# Patient Record
Sex: Male | Born: 1955 | Hispanic: No | Marital: Married | Smoking: Current every day smoker
Health system: Southern US, Community
[De-identification: ages and names within clinical notes are randomized; demographics above are authoritative.]

## PROBLEM LIST (undated history)

## (undated) ENCOUNTER — Emergency Department (HOSPITAL_COMMUNITY): Discharge status: Against Medical Advice | Payer: BC Managed Care – PPO

## (undated) DIAGNOSIS — K649 Unspecified hemorrhoids: Secondary | ICD-10-CM

## (undated) DIAGNOSIS — J45909 Unspecified asthma, uncomplicated: Secondary | ICD-10-CM

## (undated) DIAGNOSIS — K602 Anal fissure, unspecified: Secondary | ICD-10-CM

## (undated) DIAGNOSIS — D126 Benign neoplasm of colon, unspecified: Secondary | ICD-10-CM

## (undated) DIAGNOSIS — K5790 Diverticulosis of intestine, part unspecified, without perforation or abscess without bleeding: Secondary | ICD-10-CM

## (undated) HISTORY — DX: Benign neoplasm of colon, unspecified: D12.6

## (undated) HISTORY — DX: Unspecified hemorrhoids: K64.9

## (undated) HISTORY — DX: Diverticulosis of intestine, part unspecified, without perforation or abscess without bleeding: K57.90

## (undated) HISTORY — DX: Unspecified asthma, uncomplicated: J45.909

## (undated) HISTORY — PX: COLONOSCOPY: SHX174

## (undated) HISTORY — DX: Anal fissure, unspecified: K60.2

## (undated) HISTORY — PX: EXTERNAL AUDITORY CANAL RECONSTRUCTION: SHX428

---

## 2000-04-01 ENCOUNTER — Emergency Department (HOSPITAL_COMMUNITY): Admission: EM | Admit: 2000-04-01 | Discharge: 2000-04-02 | Payer: Self-pay | Admitting: Emergency Medicine

## 2001-09-16 ENCOUNTER — Ambulatory Visit (HOSPITAL_COMMUNITY): Admission: RE | Admit: 2001-09-16 | Discharge: 2001-09-16 | Payer: Self-pay | Admitting: Internal Medicine

## 2010-10-16 ENCOUNTER — Encounter: Payer: Self-pay | Admitting: Gastroenterology

## 2011-04-29 NOTE — Letter (Signed)
Summary: Colonoscopy Letter  [facility]  [address]   [place], [place] [address]   Phone: [phone]  Fax: [phone]      October 16, 2010 MRN: [id number]   Corey Camacho [address] [place], [place]  [address]   Dear [name],   According to your medical record, it is time for you to schedule a Colonoscopy. The American Cancer Society recommends this procedure as a method to detect early colon cancer. Patients with a family history of colon cancer, or a personal history of colon polyps or inflammatory bowel disease are at increased risk.  This letter has been generated based on the recommendations made at the time of your procedure. If you feel that in your particular situation this may no longer apply, please contact our office.  Please call our office at [phone] to schedule this appointment or to update your records at your earliest convenience.  Thank you for cooperating with [place] to provide you with the very best care possible.   Sincerely,   Claudette Head, M.D.  [facility] Gastroenterology [facility] [phone]

## 2012-07-15 ENCOUNTER — Encounter: Payer: Self-pay | Admitting: Gastroenterology

## 2012-07-23 ENCOUNTER — Encounter: Payer: Self-pay | Admitting: Gastroenterology

## 2012-07-27 ENCOUNTER — Encounter: Payer: Self-pay | Admitting: Gastroenterology

## 2012-08-04 DIAGNOSIS — D126 Benign neoplasm of colon, unspecified: Secondary | ICD-10-CM

## 2012-08-04 HISTORY — DX: Benign neoplasm of colon, unspecified: D12.6

## 2012-08-10 ENCOUNTER — Ambulatory Visit (AMBULATORY_SURGERY_CENTER): Payer: BC Managed Care – PPO | Admitting: *Deleted

## 2012-08-10 ENCOUNTER — Encounter: Payer: Self-pay | Admitting: Gastroenterology

## 2012-08-10 VITALS — Ht 71.0 in | Wt 212.9 lb

## 2012-08-10 DIAGNOSIS — Z1211 Encounter for screening for malignant neoplasm of colon: Secondary | ICD-10-CM

## 2012-08-10 MED ORDER — NA SULFATE-K SULFATE-MG SULF 17.5-3.13-1.6 GM/177ML PO SOLN: ORAL | Status: DC

## 2012-08-24 ENCOUNTER — Ambulatory Visit (AMBULATORY_SURGERY_CENTER): Payer: BC Managed Care – PPO | Admitting: Gastroenterology

## 2012-08-24 ENCOUNTER — Encounter: Payer: Self-pay | Admitting: Gastroenterology

## 2012-08-24 VITALS — BP 102/68 | HR 68 | Temp 98.5°F | Resp 22 | Ht 71.0 in | Wt 212.0 lb

## 2012-08-24 DIAGNOSIS — D126 Benign neoplasm of colon, unspecified: Secondary | ICD-10-CM

## 2012-08-24 DIAGNOSIS — Z1211 Encounter for screening for malignant neoplasm of colon: Secondary | ICD-10-CM

## 2012-08-24 MED ORDER — SODIUM CHLORIDE 0.9 % IV SOLN: 500.0000 mL | INTRAVENOUS | Status: DC

## 2012-08-24 NOTE — Progress Notes (Signed)
Patient did not experience any of the following events: a burn prior to discharge; a fall within the facility; wrong site/side/patient/procedure/implant event; or a hospital transfer or hospital admission upon discharge from the facility. (G8907) Patient did not have preoperative order for IV antibiotic SSI prophylaxis. (G8918)  

## 2012-08-24 NOTE — Patient Instructions (Addendum)
Colon polyp x 1 removed,diverticulosis and hemorrhoids seen. Try to follow high fiber diet with liberal fluid intake. See All handouts. Resume current medications. Call us with any questions or concerns. Thank you!!  YOU HAD AN ENDOSCOPIC PROCEDURE TODAY AT THE  ENDOSCOPY CENTER: Refer to the procedure report that was given to you for any specific questions about what was found during the examination.  If the procedure report does not answer your questions, please call your gastroenterologist to clarify.  If you requested that your care partner not be given the details of your procedure findings, then the procedure report has been included in a sealed envelope for you to review at your convenience later.  YOU SHOULD EXPECT: Some feelings of bloating in the abdomen. Passage of more gas than usual.  Walking can help get rid of the air that was put into your GI tract during the procedure and reduce the bloating. If you had a lower endoscopy (such as a colonoscopy or flexible sigmoidoscopy) you may notice spotting of blood in your stool or on the toilet paper. If you underwent a bowel prep for your procedure, then you may not have a normal bowel movement for a few days.  DIET: Your first meal following the procedure should be a light meal and then it is ok to progress to your normal diet.  A half-sandwich or bowl of soup is an example of a good first meal.  Heavy or fried foods are harder to digest and may make you feel nauseous or bloated.  Likewise meals heavy in dairy and vegetables can cause extra gas to form and this can also increase the bloating.  Drink plenty of fluids but you should avoid alcoholic beverages for 24 hours.  ACTIVITY: Your care partner should take you home directly after the procedure.  You should plan to take it easy, moving slowly for the rest of the day.  You can resume normal activity the day after the procedure however you should NOT DRIVE or use heavy machinery for 24 hours  (because of the sedation medicines used during the test).    SYMPTOMS TO REPORT IMMEDIATELY: A gastroenterologist can be reached at any hour.  During normal business hours, 8:30 AM to 5:00 PM Monday through Friday, call [phone].  After hours and on weekends, please call the GI answering service at [phone] who will take a message and have the physician on call contact you.   Following lower endoscopy (colonoscopy or flexible sigmoidoscopy):  Excessive amounts of blood in the stool  Significant tenderness or worsening of abdominal pains  Swelling of the abdomen that is new, acute  Fever of 100F or higher  FOLLOW UP: If any biopsies were taken you will be contacted by phone or by letter within the next 1-3 weeks.  Call your gastroenterologist if you have not heard about the biopsies in 3 weeks.  Our staff will call the home number listed on your records the next business day following your procedure to check on you and address any questions or concerns that you may have at that time regarding the information given to you following your procedure. This is a courtesy call and so if there is no answer at the home number and we have not heard from you through the emergency physician on call, we will assume that you have returned to your regular daily activities without incident.  SIGNATURES/CONFIDENTIALITY: You and/or your care partner have signed paperwork which will be entered into your  electronic medical record.  These signatures attest to the fact that that the information above on your After Visit Summary has been reviewed and is understood.  Full responsibility of the confidentiality of this discharge information lies with you and/or your care-partner.

## 2012-08-24 NOTE — Progress Notes (Signed)
Pressure applied to abdomen to reach cecum.  

## 2012-08-24 NOTE — Op Note (Signed)
[facility] [address] [place], [address]   COLONOSCOPY PROCEDURE REPORT  PATIENT: Corey Camacho, Corey Camacho  MR#: [id number] BIRTHDATE: 1956/07/25 , 56  yrs. old GENDER: Male ENDOSCOPIST: Meryl Dare, MD, [facility]                        n] PROCEDURE DATE:  08/24/2012 PROCEDURE:   Colonoscopy with snare polypectomy ASA CLASS:   Class II INDICATIONS:average risk screening. MEDICATIONS: MAC sedation, administered by CRNA and propofol (Diprivan) 270mg  IV DESCRIPTION OF PROCEDURE:   After the risks benefits and alternatives of the procedure were thoroughly explained, informed consent was obtained.  A digital rectal exam revealed no abnormalities of the rectum.   The     endoscope was introduced through the anus and advanced to the cecum, which was identified by both the appendix and ileocecal valve. No adverse events experienced.   The quality of the prep was good, using MoviPrep The instrument was then slowly withdrawn as the colon was fully examined.   COLON FINDINGS: A sessile polyp measuring 6 mm in size was found at the hepatic flexure.  A polypectomy was performed with a cold snare.  The resection was complete and the polyp tissue was completely retrieved.   Mild diverticulosis was noted in the transverse colon.   The colon was otherwise normal.  There was no diverticulosis, inflammation, polyps or cancers unless previously stated.  Retroflexed views revealed small internal hemorrhoids. The time to cecum=3 minutes 23 seconds.  Withdrawal time=8 minutes 52 seconds.  The scope was withdrawn and the procedure completed. COMPLICATIONS: There were no complications.  ENDOSCOPIC IMPRESSION: 1.   Sessile polyp at the hepatic flexure; polypectomy was performed with a cold snare 2.   Mild diverticulosis was noted in the transverse colon 3.   Small internal hemorrhoids  RECOMMENDATIONS: 1.  await pathology results 2.  Repeat colonoscopy in 5 years if polyp adenomatous;  otherwise 10 years 3.  High fiber diet with liberal fluid intake.   eSigned:  Meryl Dare, MD, [facility] 08/24/2012 2:33 PM   cc: Jarome Matin, MD

## 2012-08-25 ENCOUNTER — Telehealth: Payer: Self-pay | Admitting: *Deleted

## 2012-08-25 NOTE — Telephone Encounter (Signed)
  Follow up Call-  Call back number 08/24/2012  Post procedure Call Back phone  # [phone]  Permission to leave phone message Yes     Patient questions:  Do you have a fever, pain , or abdominal swelling? no Pain Score  0 *  Have you tolerated food without any problems? yes  Have you been able to return to your normal activities? yes  Do you have any questions about your discharge instructions: Diet   no Medications  no Follow up visit  no  Do you have questions or concerns about your Care? no  Actions: * If pain score is 4 or above: No action needed, pain <4.  Pt. Has slight "bloating" without pain.  Advised to be up and around today to help air to move out.  Also, to call office if "bloating" increases or pain occurs.  He is eating without discomfort and is afebrile.

## 2012-08-27 ENCOUNTER — Encounter: Payer: Self-pay | Admitting: Gastroenterology

## 2013-08-09 ENCOUNTER — Telehealth: Payer: Self-pay | Admitting: Gastroenterology

## 2013-08-09 NOTE — Telephone Encounter (Signed)
Patient with one month history of bright red rectal bleeding and pain with a BM.  "good amount of blood" with each BM.  He is not passing blood independent of stool.  He will come in tomorrow and see Mike Gip PA at 9:30

## 2013-08-10 ENCOUNTER — Ambulatory Visit (INDEPENDENT_AMBULATORY_CARE_PROVIDER_SITE_OTHER): Payer: BC Managed Care – PPO | Admitting: Physician Assistant

## 2013-08-10 ENCOUNTER — Encounter: Payer: Self-pay | Admitting: Physician Assistant

## 2013-08-10 VITALS — BP 106/68 | HR 70 | Ht 71.0 in | Wt 212.0 lb

## 2013-08-10 DIAGNOSIS — Z86010 Personal history of colonic polyps: Secondary | ICD-10-CM

## 2013-08-10 DIAGNOSIS — K602 Anal fissure, unspecified: Secondary | ICD-10-CM

## 2013-08-10 DIAGNOSIS — K5730 Diverticulosis of large intestine without perforation or abscess without bleeding: Secondary | ICD-10-CM

## 2013-08-10 DIAGNOSIS — K625 Hemorrhage of anus and rectum: Secondary | ICD-10-CM

## 2013-08-10 MED ORDER — DILTIAZEM GEL 2 %: 1.0000 "application " | Freq: Three times a day (TID) | CUTANEOUS | Status: DC

## 2013-08-10 MED ORDER — LIDOCAINE (ANORECTAL) 5 % EX CREA: 1.0000 "application " | TOPICAL_CREAM | Freq: Three times a day (TID) | CUTANEOUS | Status: DC

## 2013-08-10 NOTE — Patient Instructions (Addendum)
You have been given a separate informational sheet regarding your tobacco use, the importance of quitting and local resources to help you quit. Do Sitz bath once daily. We have given you Recticare samples, use 2-3 times daily rectally for pain. We called in a prescription to [facility] [phone] ) located at the [facility], between [address] and [address].   Diltiazem 2 % Gel, use rectally 3 times daily.    We made you a follow up with Dr. Claudette Head on 09-09-2013 @ 11:15 AM.

## 2013-08-10 NOTE — Progress Notes (Signed)
Subjective:    Patient ID: Corey ANDRADA, male    DOB: 1956/09/18, 57 y.o.   MRN: [id number]  HPI  Corey Camacho is a pleasant 57 year old male known to Dr. Russella Dar from colonoscopy done in October of 2013. This was done for screening and he was found to have a 6 mm sessile polyp at the hepatic flexure which was a tubular adenoma, also had mild left-sided diverticulosis and small internal hemorrhoids. He comes in today with new complaint of rectal bleeding and rectal pain. He says he went [place] in [place] and had some problems with constipation while there he came back in August and says he went for many hours without having a bowel movement etc. with long overseas flights and and when he was able to have a bowel movement passed hard stool and did develop rectal pain with some blood noted in the commode. He says he waited several weeks thinking this would clear however he continues to have pain in his rectum with bowel movements. He is having a bowel movement about every other day and says his stools are not really constipated at this point. He does have MiraLax to use at home as needed. He has no complaints of abdominal pain cramping pressure changes in his appetite etc. He is still having rectal discomfort primarily with and after a bowel movement, and sees intermittent small amounts of bright red blood    Review of Systems  Constitutional: Negative.   HENT: Negative.   Eyes: Negative.   Respiratory: Negative.   Cardiovascular: Negative.   Gastrointestinal: Positive for constipation, anal bleeding and rectal pain.  Endocrine: Negative.   Genitourinary: Negative.   Musculoskeletal: Negative.   Skin: Negative.   Allergic/Immunologic: Negative.   Neurological: Negative.   Hematological: Negative.   Psychiatric/Behavioral: Negative.    Outpatient Prescriptions Prior to Visit  Medication Sig Dispense Refill  . HYDROcodone-homatropine (HYCODAN) 5-1.5 MG/5ML syrup       . Misc Natural Products (MENS  PROSTATE HEALTH FORMULA PO) Take 2 capsules by mouth daily.       No facility-administered medications prior to visit.       Allergies  Allergen Reactions  . Accutane [Isotretinoin] Swelling   Patient Active Problem List   Diagnosis Date Noted  . H/O adenomatous polyp of colon 08/10/2013  . Diverticulosis of colon without hemorrhage 08/10/2013   History  Substance Use Topics  . Smoking status: Current Every Day Smoker -- 0.50 packs/day    Types: Cigarettes  . Smokeless tobacco: Never Used  . Alcohol Use: 2.0 oz/week    4 drink(s) per week   family history includes Esophageal cancer in his father; Prostate cancer in his father. There is no history of Colon cancer or Stomach cancer.  Objective:   Physical Exam  well-developed male in no acute distress, pleasant blood pressure 106/68 pulse 70 height 5 foot 11 weight 212. HEENT; nontraumatic normocephalic EOMI PERRLA sclera anicteric, Supple; no JVD, Cardiovascular; regular rate and rhythm with S1-S2 no murmur or gallop, Pulmonary; clear bilaterally, Abdomen; soft nontender nondistended bowel sounds are active there is no palpable mass or hepatosplenomegaly, Rectal; exam he has an external tag at the 12:00 position and on anoscopy and digital exam has a small fissure at the 12:00 position. Ext;; no clubbing cyanosis or edema skin warm and dry, Psych ;mood and affect normal and appropriate        Assessment & Plan:    #26 57 year old male with an anal fissure probably present over the  past 2 months. #2 small internal hemorrhoids #3 adenomatous colon polyp last colonoscopy October 2013 #4 mild constipation #5 diverticulosis  Plan; patient is advised to start sitz bath at least on a once daily basis over the next few weeks MiraLax 17 g in 8 ounces of water daily as needed Start recti care/lidocaine ointment 5% in 2-3 times daily as needed for pain and discomfort Start Cardizem gel 2% applied to 3 times daily and may alternate with  the lidocaine ointment He'll followup with Dr. Russella Dar in 4-5 weeks If he remains symptomatic at that point may need to consider Botox injections or surgical referral

## 2013-08-12 NOTE — Progress Notes (Signed)
Reviewed and agree with management plan.  Ardelle Haliburton T. Mariadejesus Cade, MD FACG 

## 2013-09-09 ENCOUNTER — Ambulatory Visit: Payer: BC Managed Care – PPO | Admitting: Gastroenterology

## 2013-09-21 ENCOUNTER — Ambulatory Visit (INDEPENDENT_AMBULATORY_CARE_PROVIDER_SITE_OTHER): Payer: BC Managed Care – PPO | Admitting: General Surgery

## 2013-09-21 ENCOUNTER — Encounter (INDEPENDENT_AMBULATORY_CARE_PROVIDER_SITE_OTHER): Payer: Self-pay | Admitting: General Surgery

## 2013-09-21 ENCOUNTER — Encounter (INDEPENDENT_AMBULATORY_CARE_PROVIDER_SITE_OTHER): Payer: Self-pay

## 2013-09-21 VITALS — BP 114/68 | HR 80 | Temp 97.0°F | Resp 16 | Ht 71.0 in | Wt 214.4 lb

## 2013-09-21 DIAGNOSIS — L0231 Cutaneous abscess of buttock: Secondary | ICD-10-CM

## 2013-09-21 NOTE — Progress Notes (Signed)
Subjective:     Patient ID: Corey Camacho, male   DOB: 03/28/1956, 57 y.o.   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 The patient has been there for 5+ years. He states his recent episode has been occurring for the last 2-4 weeks. Patient states that she has had some minimal drainage there this time, mainly blood  Review of Systems  Constitutional: Negative.  Negative for fever.  HENT: Negative.   Respiratory: Negative.   Cardiovascular: Negative.   Gastrointestinal: Negative.   Neurological: Negative.   All other systems reviewed and are negative.       Objective:   Physical Exam  Constitutional: He is oriented to person, place, and time. He appears well-developed and well-nourished.  HENT:  Head: Normocephalic and atraumatic.  Eyes: Conjunctivae and EOM are normal. Pupils are equal, round, and reactive to light.  Neck: Normal range of motion. Neck supple.  Cardiovascular: Normal rate, regular rhythm and normal heart sounds.   Pulmonary/Chest: Effort normal and breath sounds normal.  Abdominal: Soft. Bowel sounds are normal.  Genitourinary:     Musculoskeletal: Normal range of motion.  Neurological: He is alert and oriented to person, place, and time.       Assessment:     57 year old male with a chronic left gluteal drain abscess.     Plan:     1. A number the area of concern and aspirated with an 18-gauge needle. There was no expression of purulence. 2. The patient was given a prescription for doxycycline, by Dr. Jarold Motto. He can continue this as prescribed 3.  We will have patient follow back up in 2 weeks for a wound check. Should area notresolved we can discuss exam under anesthesia and excision of abscess/chronic infected abscess.

## 2013-10-05 ENCOUNTER — Encounter (INDEPENDENT_AMBULATORY_CARE_PROVIDER_SITE_OTHER): Payer: Self-pay

## 2013-10-05 ENCOUNTER — Encounter (INDEPENDENT_AMBULATORY_CARE_PROVIDER_SITE_OTHER): Payer: Self-pay | Admitting: General Surgery

## 2013-10-05 ENCOUNTER — Ambulatory Visit (INDEPENDENT_AMBULATORY_CARE_PROVIDER_SITE_OTHER): Payer: BC Managed Care – PPO | Admitting: General Surgery

## 2013-10-05 VITALS — BP 132/72 | HR 74 | Temp 97.6°F | Resp 18 | Ht 71.0 in | Wt 217.0 lb

## 2013-10-05 DIAGNOSIS — L0231 Cutaneous abscess of buttock: Secondary | ICD-10-CM

## 2013-10-05 NOTE — Progress Notes (Signed)
Subjective:     Patient ID: Corey Camacho, male   DOB: October 28, 1956, 57 y.o.   MRN: [id number]  HPI The patient has been doing well after his antibiotic treatment. He states there is no pain or drainage coming from the area of concern. She had no fevers at home her pain with defecation.  Review of Systems  Constitutional: Negative.   HENT: Negative.   Respiratory: Negative.   Cardiovascular: Negative.   Gastrointestinal: Negative.   Neurological: Negative.        Objective:   Physical Exam  Constitutional: He is oriented to person, place, and time. He appears well-developed and well-nourished.  HENT:  Head: Normocephalic and atraumatic.  Eyes: Conjunctivae and EOM are normal. Pupils are equal, round, and reactive to light.  Neck: Normal range of motion. Neck supple.  Cardiovascular: Normal rate, regular rhythm and normal heart sounds.   Pulmonary/Chest: Effort normal and breath sounds normal.  Abdominal: Soft. Bowel sounds are normal.  Neurological: He is alert and oriented to person, place, and time.  Skin: Skin is warm and dry.       Assessment:     57 year old male with a resolved perirectal abscess     Plan:     1. Patient to return as needed. 2. If this returns with a discussed exam under anesthesia for possible fistula.

## 2013-10-07 ENCOUNTER — Encounter (INDEPENDENT_AMBULATORY_CARE_PROVIDER_SITE_OTHER): Payer: BC Managed Care – PPO | Admitting: General Surgery

## 2013-12-20 ENCOUNTER — Ambulatory Visit
Admission: RE | Admit: 2013-12-20 | Discharge: 2013-12-20 | Disposition: A | Discharge status: Home / Self Care | Payer: BC Managed Care – PPO | Source: Ambulatory Visit | Attending: Allergy | Admitting: Allergy

## 2013-12-20 ENCOUNTER — Other Ambulatory Visit: Payer: Self-pay | Admitting: Allergy

## 2013-12-20 DIAGNOSIS — J329 Chronic sinusitis, unspecified: Secondary | ICD-10-CM

## 2015-02-15 ENCOUNTER — Ambulatory Visit
Admission: RE | Admit: 2015-02-15 | Discharge: 2015-02-15 | Disposition: A | Discharge status: Home / Self Care | Payer: Self-pay | Source: Ambulatory Visit | Attending: Allergy | Admitting: Allergy

## 2015-02-15 ENCOUNTER — Other Ambulatory Visit: Payer: Self-pay | Admitting: Allergy

## 2015-02-15 DIAGNOSIS — J209 Acute bronchitis, unspecified: Secondary | ICD-10-CM

## 2015-02-26 ENCOUNTER — Encounter (HOSPITAL_COMMUNITY): Payer: Self-pay | Admitting: *Deleted

## 2015-02-26 DIAGNOSIS — J159 Unspecified bacterial pneumonia: Secondary | ICD-10-CM | POA: Diagnosis not present

## 2015-02-26 DIAGNOSIS — Z7952 Long term (current) use of systemic steroids: Secondary | ICD-10-CM | POA: Diagnosis not present

## 2015-02-26 DIAGNOSIS — Z720 Tobacco use: Secondary | ICD-10-CM | POA: Insufficient documentation

## 2015-02-26 DIAGNOSIS — Z7951 Long term (current) use of inhaled steroids: Secondary | ICD-10-CM | POA: Insufficient documentation

## 2015-02-26 DIAGNOSIS — Z8719 Personal history of other diseases of the digestive system: Secondary | ICD-10-CM | POA: Diagnosis not present

## 2015-02-26 DIAGNOSIS — R05 Cough: Secondary | ICD-10-CM | POA: Diagnosis present

## 2015-02-26 LAB — BASIC METABOLIC PANEL
Anion gap: 9 (ref 5–15)
BUN: 18 mg/dL (ref 6–23)
CHLORIDE: 102 mmol/L (ref 96–112)
CO2: 24 mmol/L (ref 19–32)
Calcium: 8.4 mg/dL (ref 8.4–10.5)
Creatinine, Ser: 1.04 mg/dL (ref 0.50–1.35)
GFR calc non Af Amer: 78 mL/min — ABNORMAL LOW (ref >90)
Glucose, Bld: 128 mg/dL — ABNORMAL HIGH (ref 70–99)
Potassium: 4.2 mmol/L (ref 3.5–5.1)
Sodium: 135 mmol/L (ref 135–145)

## 2015-02-26 LAB — CBC
HEMATOCRIT: 47.2 % (ref 39.0–52.0)
Hemoglobin: 15.8 g/dL (ref 13.0–17.0)
MCH: 27.1 pg (ref 26.0–34.0)
MCHC: 33.5 g/dL (ref 30.0–36.0)
MCV: 80.8 fL (ref 78.0–100.0)
PLATELETS: 248 10*3/uL (ref 150–400)
RBC: 5.84 MIL/uL — AB (ref 4.22–5.81)
RDW: 15.8 % — ABNORMAL HIGH (ref 11.5–15.5)
WBC: 12.7 10*3/uL — ABNORMAL HIGH (ref 4.0–10.5)

## 2015-02-26 NOTE — ED Notes (Signed)
Pt states that he was seen by his PCP 2 weeks ago and prescribed antibiotics. States he then went to Dr Donneta Romberg for his allergies and was given inhaler and prednisone. Currently c/o cough, fever, weakness.

## 2015-02-27 ENCOUNTER — Emergency Department (HOSPITAL_COMMUNITY)
Admission: EM | Admit: 2015-02-27 | Discharge: 2015-02-27 | Disposition: A | Discharge status: Home / Self Care | Payer: 59 | Attending: Emergency Medicine | Admitting: Emergency Medicine

## 2015-02-27 ENCOUNTER — Emergency Department (HOSPITAL_COMMUNITY): Payer: 59

## 2015-02-27 DIAGNOSIS — R05 Cough: Secondary | ICD-10-CM

## 2015-02-27 DIAGNOSIS — J189 Pneumonia, unspecified organism: Secondary | ICD-10-CM

## 2015-02-27 DIAGNOSIS — R059 Cough, unspecified: Secondary | ICD-10-CM

## 2015-02-27 MED ORDER — LEVOFLOXACIN 750 MG PO TABS: 750.0000 mg | ORAL_TABLET | Freq: Every day | ORAL | Status: DC

## 2015-02-27 MED ORDER — LEVOFLOXACIN 750 MG PO TABS
750.0000 mg | ORAL_TABLET | Freq: Once | ORAL | Status: AC
  Administered 2015-02-27: 750 mg via ORAL
  Filled 2015-02-27: qty 1

## 2015-02-27 NOTE — ED Provider Notes (Signed)
CSN: [id number]     Arrival date & time 02/26/15  2240 History   First MD Initiated Contact with Patient 02/27/15 0018     This chart was scribed for Corey Rice, MD by Forrestine Him, ED Scribe. This patient was seen in room A12C/A12C and the patient's care was started 12:26 AM.   No chief complaint on file.  HPI  HPI Comments: Corey Camacho is a 59 y.o. male without any pertinent past medical history who presents to the Emergency Department complaining of productive cough of yellow sputum. Pt also reportsassociated low grade fever, chills, nasal congestion, generalized weakness and myalgias. Pt was evaluated by his PCP 2 weeks ago and was started on an antibiotic for cough, wheezing, and nasal congestion. At time of appointment, CXR performed without any evidence of PNA. Pt states he then saw Dr. Donneta Romberg 4/18 for allergies and was given prescriptions for Prednisone, rescue inhaler, and nasal spray. Daughter was recently diagnosed with walking PNA 1 week ago. Pt with known allergy to Accutane.  Past Medical History  Diagnosis Date  . Diverticulosis   . Hemorrhoids   . Tubular adenoma of colon 08/2012  . Anal fissure    Past Surgical History  Procedure Laterality Date  . 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 Colon cancer Neg Hx   . Stomach cancer Neg Hx   . Esophageal cancer Father   . Prostate cancer Father    History  Substance Use Topics  . Smoking status: Current Every Day Smoker -- 0.50 packs/day    Types: Cigarettes  . Smokeless tobacco: Never Used  . Alcohol Use: 2.0 oz/week    4 Standard drinks or equivalent per week    Review of Systems  Constitutional: Positive for fever, chills and fatigue.  HENT: Positive for congestion. Negative for sinus pressure and sore throat.   Respiratory: Positive for cough. Negative for shortness of breath and wheezing.   Cardiovascular: Negative for  chest pain, palpitations and leg swelling.  Gastrointestinal: Negative for nausea, vomiting and abdominal pain.  Genitourinary: Negative for dysuria and flank pain.  Musculoskeletal: Positive for myalgias. Negative for back pain, arthralgias, neck pain and neck stiffness.  Skin: Negative for rash and wound.  Neurological: Positive for weakness. Negative for dizziness, light-headedness, numbness and headaches.  All other systems reviewed and are negative.     Allergies  Accutane  Home Medications   Prior to Admission medications   Medication Sig Start Date End Date Taking? Authorizing Provider  BREO ELLIPTA 200-25 MCG/INH AEPB Inhale 1 puff into the lungs daily.    Yes Historical Provider, MD  fluticasone (FLONASE) 50 MCG/ACT nasal spray Place 2 sprays into the nose daily.  02/13/15  Yes Historical Provider, MD  levocetirizine (XYZAL) 5 MG tablet Take 5 mg by mouth every evening.  02/13/15  Yes Historical Provider, MD  predniSONE (DELTASONE) 10 MG tablet Take 10 mg by mouth daily with breakfast.  02/13/15 02/27/15 Yes Historical Provider, MD  diltiazem 2 % GEL Apply 1 application topically 3 (three) times daily. Patient not taking: Reported on 02/27/2015 08/10/13   Amy S Esterwood, PA-C  levofloxacin (LEVAQUIN) 750 MG tablet Take 1 tablet (750 mg total) by mouth daily. X 7 days 02/27/15   Corey Rice, MD  Lidocaine, Anorectal, (RECTICARE) 5 % CREA Apply 1 application topically 3 (three) times daily. Patient not taking: Reported on 02/27/2015 08/10/13  Amy S Esterwood, PA-C   Triage Vitals: BP 109/68 mmHg  Pulse 84  Temp(Src) 99.6 F (37.6 C) (Oral)  Resp 22  Ht 5\' 11"  (1.803 m)  Wt 205 lb (92.987 kg)  BMI 28.60 kg/m2  SpO2 92%   Physical Exam  Constitutional: He is oriented to person, place, and time. He appears well-developed and well-nourished. No distress.  HENT:  Head: Normocephalic and atraumatic.  Mouth/Throat: Oropharynx is clear and moist. No oropharyngeal exudate.  No  sinus tenderness with percussion.  Eyes: EOM are normal. Pupils are equal, round, and reactive to light.  Neck: Normal range of motion. Neck supple.  No meningismus  Cardiovascular: Normal rate and regular rhythm.   Pulmonary/Chest: Effort normal. No respiratory distress. He has no wheezes. He has rales ( rales in bilateral bases).  No respiratory distress  Abdominal: Soft. Bowel sounds are normal. He exhibits no distension and no mass. There is no tenderness. There is no rebound and no guarding.  Musculoskeletal: Normal range of motion. He exhibits no edema or tenderness.  Lymphadenopathy:    He has no cervical adenopathy.  Neurological: He is alert and oriented to person, place, and time.  5/5 motor in all tremors. Sensation is fully intact.  Skin: Skin is warm and dry. No rash noted. No erythema.  Psychiatric: He has a normal mood and affect. His behavior is normal.  Nursing note and vitals reviewed.   ED Course  Procedures (including critical care time)  DIAGNOSTIC STUDIES: Oxygen Saturation is 95% on RA, adequate by my interpretation.    COORDINATION OF CARE: 2:29 AM-Discussed treatment plan with pt at bedside and pt agreed to plan.     Labs Review Labs Reviewed  CBC - Abnormal; Notable for the following:    WBC 12.7 (*)    RBC 5.84 (*)    RDW 15.8 (*)    All other components within normal limits  BASIC METABOLIC PANEL - Abnormal; Notable for the following:    Glucose, Bld 128 (*)    GFR calc non Af Amer 78 (*)    All other components within normal limits    Imaging Review No results found.   EKG Interpretation None      MDM   Final diagnoses:  Cough  Community acquired pneumonia   I personally performed the services described in this documentation, which was scribed in my presence. The recorded information has been reviewed and is accurate.  Crystals infiltrate in left base. Mild elevation of white blood cell count. We'll treat for the record pneumonia.  Given first dose of Levaquin in the emergency department.  Since stable. Patient is in no wrist or distress. We'll discharge home and advised to follow-up with his primary physician to assure improvement of symptoms. He's been instructed to return for worsening fever, difficulty breathing or any concerns.  Corey Rice, MD 02/27/15 [phone]

## 2015-02-27 NOTE — Discharge Instructions (Signed)

## 2016-07-05 ENCOUNTER — Encounter: Payer: Self-pay | Admitting: Podiatry

## 2016-07-05 ENCOUNTER — Ambulatory Visit (INDEPENDENT_AMBULATORY_CARE_PROVIDER_SITE_OTHER): Payer: Self-pay | Admitting: Podiatry

## 2016-07-05 DIAGNOSIS — L309 Dermatitis, unspecified: Secondary | ICD-10-CM

## 2016-07-05 DIAGNOSIS — B351 Tinea unguium: Secondary | ICD-10-CM

## 2016-07-05 MED ORDER — TERBINAFINE HCL 250 MG PO TABS: ORAL_TABLET | ORAL | 0 refills | Status: DC

## 2016-07-05 NOTE — Progress Notes (Signed)
Chief Complaint  Patient presents with  . Toe Pain    bilateral rash between toes with itching and redness x 2 wks .... right foot blister on right 4th toe.

## 2016-07-05 NOTE — Progress Notes (Signed)
Subjective:     Patient ID: Corey Camacho, male   DOB: [birth year], 60 y.o.   MRN: [id number]  HPI patient presents with small blisters between the second and third toes right foot localized in nature with no proximal edema and he states it just occur   Review of Systems     Objective:   Physical Exam Neurovascular status intact negative Homans sign noted normal health history with patient noted to have a small blister on the right second toe localized in nature with redness surrounding the area    Assessment:     Localized mycotic infection    Plan:     Reviewed utilization of medication and recommended oral Lamisil pulse therapy along with topical medicines. Reappoint as needed

## 2016-07-05 NOTE — Progress Notes (Signed)
   Subjective:    Patient ID: Corey Camacho, male    DOB: 06-Aug-1956, 60 y.o.   MRN: [id number]  HPI    Review of Systems  Respiratory: Positive for cough.   Skin: Positive for rash.  All other systems reviewed and are negative.      Objective:   Physical Exam        Assessment & Plan:

## 2017-03-28 IMAGING — DX DG CHEST 2V
2 series · 2 of 2 positions shown · non-contrast
Comparison: Chest radiograph performed 02/15/2015

CLINICAL DATA: Acute onset of cough, congestion and fever. Initial
encounter.

EXAM:
CHEST  2 VIEW

[chest pa]
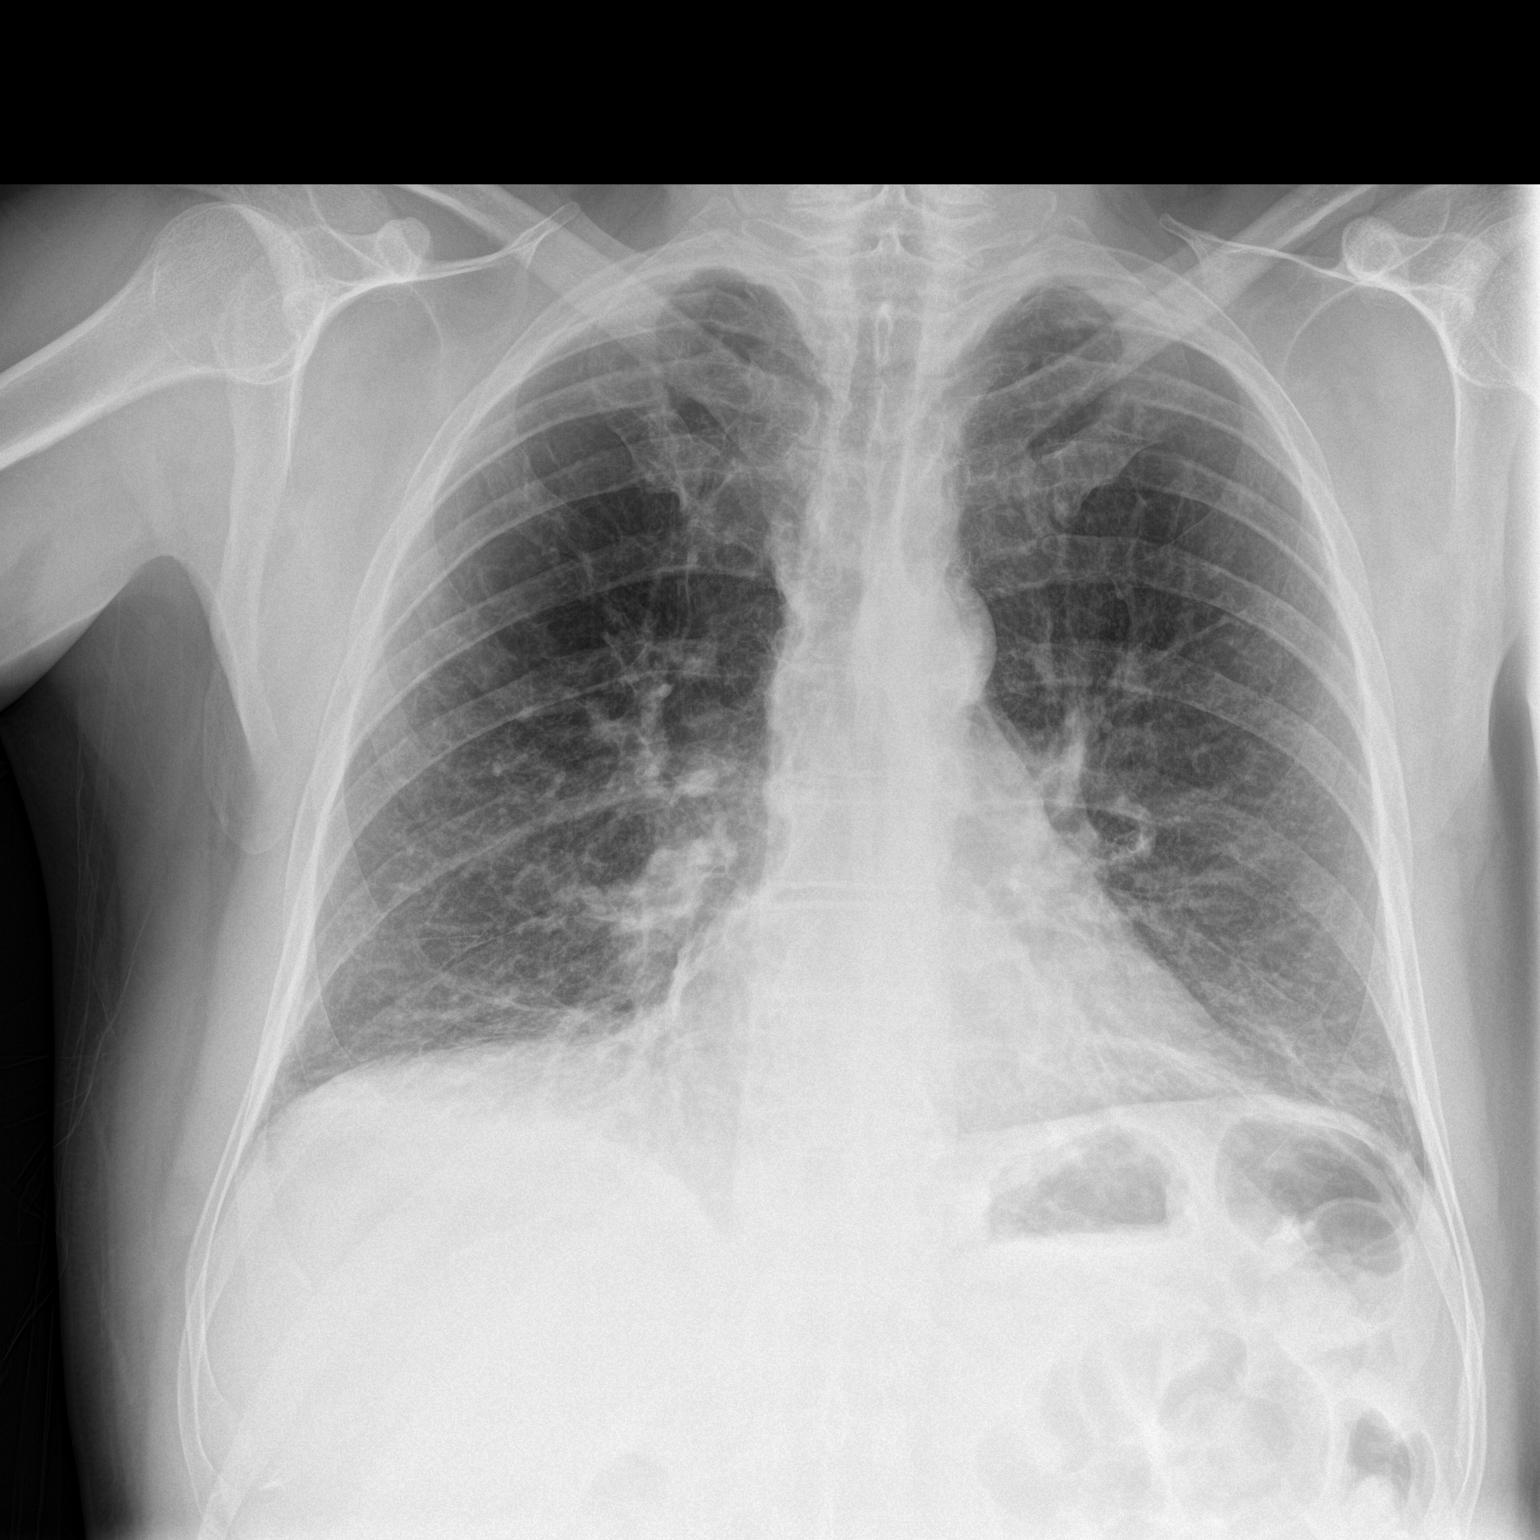

[chest lat]
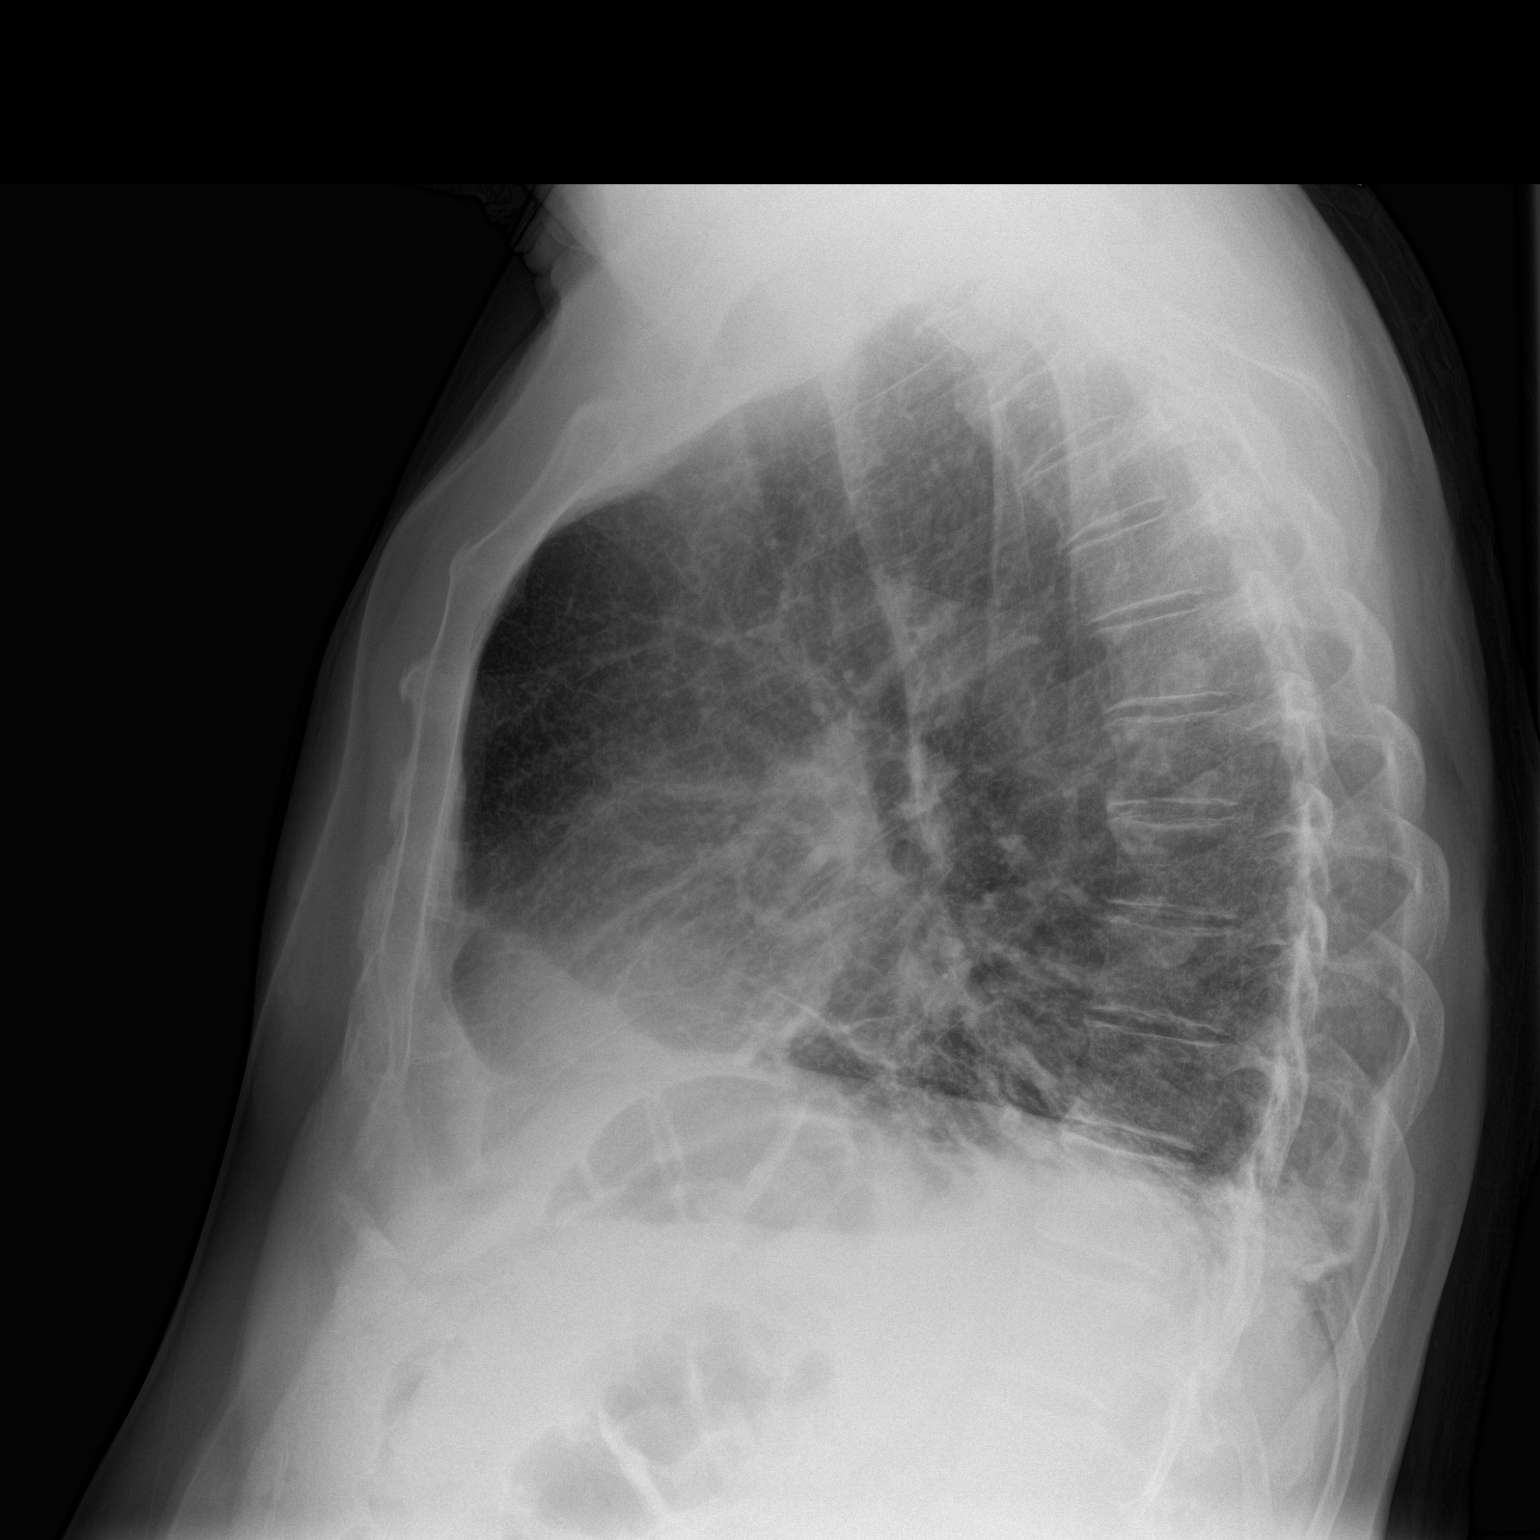

[2 of 2 positions shown; findings below may reference images not displayed]

FINDINGS: The lungs are well-aerated. Mild bibasilar opacities may reflect
mild interstitial edema, given underlying vascular congestion. Given
the patient's symptoms, pneumonia might have a similar appearance.
No pleural effusion or pneumothorax is seen.

The heart is normal in size; the mediastinal contour is within
normal limits. No acute osseous abnormalities are seen.
IMPRESSION: Mild bibasilar airspace opacities may reflect mild interstitial
edema, given underlying vascular congestion. Given the patient's
symptoms, pneumonia might have a similar appearance.

## 2017-08-26 ENCOUNTER — Encounter: Payer: Self-pay | Admitting: Gastroenterology

## 2017-10-07 ENCOUNTER — Ambulatory Visit (AMBULATORY_SURGERY_CENTER): Payer: Self-pay

## 2017-10-07 VITALS — Ht 70.0 in | Wt 198.2 lb

## 2017-10-07 DIAGNOSIS — Z86010 Personal history of colonic polyps: Secondary | ICD-10-CM

## 2017-10-07 MED ORDER — NA SULFATE-K SULFATE-MG SULF 17.5-3.13-1.6 GM/177ML PO SOLN: 1.0000 | Freq: Once | ORAL | 0 refills | Status: AC

## 2017-10-07 NOTE — Progress Notes (Signed)
Per pt, no allergies to soy or egg products.Pt not taking any weight loss meds or using  O2 at home.  Pt refused emmi video. 

## 2017-10-21 ENCOUNTER — Other Ambulatory Visit: Payer: Self-pay

## 2017-10-21 ENCOUNTER — Encounter: Payer: Self-pay | Admitting: Gastroenterology

## 2017-10-21 ENCOUNTER — Ambulatory Visit (AMBULATORY_SURGERY_CENTER): Payer: BLUE CROSS/BLUE SHIELD | Admitting: Gastroenterology

## 2017-10-21 VITALS — BP 103/69 | HR 62 | Temp 97.7°F | Resp 18 | Ht 70.0 in | Wt 198.0 lb

## 2017-10-21 DIAGNOSIS — D124 Benign neoplasm of descending colon: Secondary | ICD-10-CM | POA: Diagnosis not present

## 2017-10-21 DIAGNOSIS — D125 Benign neoplasm of sigmoid colon: Secondary | ICD-10-CM

## 2017-10-21 DIAGNOSIS — D120 Benign neoplasm of cecum: Secondary | ICD-10-CM | POA: Diagnosis not present

## 2017-10-21 DIAGNOSIS — Z86010 Personal history of colonic polyps: Secondary | ICD-10-CM | POA: Diagnosis not present

## 2017-10-21 DIAGNOSIS — D123 Benign neoplasm of transverse colon: Secondary | ICD-10-CM

## 2017-10-21 MED ORDER — SODIUM CHLORIDE 0.9 % IV SOLN: 500.0000 mL | Freq: Once | INTRAVENOUS | Status: DC

## 2017-10-21 NOTE — Op Note (Signed)
[name] Procedure Date: 10/21/2017 11:13 AM MRN: [id number] Endoscopist: Ladene Artist , MD Age: 61 Referring MD:  Date of Birth: [birth year] Gender: Male Account #: [id number] Procedure:                Colonoscopy Indications:              Surveillance: Personal history of adenomatous                            polyps on last colonoscopy 5 years ago Medicines:                Monitored Anesthesia Care Procedure:                Pre-Anesthesia Assessment:                           - Prior to the procedure, a History and Physical                            was performed, and patient medications and                            allergies were reviewed. The patient's tolerance of                            previous anesthesia was also reviewed. The risks                            and benefits of the procedure and the sedation                            options and risks were discussed with the patient.                            All questions were answered, and informed consent                            was obtained. Prior Anticoagulants: The patient has                            taken no previous anticoagulant or antiplatelet                            agents. ASA Grade Assessment: II - A patient with                            mild systemic disease. After reviewing the risks                            and benefits, the patient was deemed in                            satisfactory condition to undergo the procedure.  After obtaining informed consent, the colonoscope                            was passed under direct vision. Throughout the                            procedure, the patient's blood pressure, pulse, and                            oxygen saturations were monitored continuously. The                            Model PCF-[id number]) scope was introduced                            through the anus and  advanced to the the cecum,                            identified by appendiceal orifice and ileocecal                            valve. The ileocecal valve, appendiceal orifice,                            and rectum were photographed. The quality of the                            bowel preparation was good after extensive lavage                            and suctioning. The patient tolerated the procedure                            well. The colonoscopy was somewhat difficult due to                            a tortuous colon. Successful completion of the                            procedure was aided by using manual pressure,                            straightening and shortening the scope to obtain                            bowel loop reduction and using scope torsion. Scope In: 94:70:96 AM Scope Out: 11:39:33 AM Scope Withdrawal Time: 0 hours 16 minutes 14 seconds  Total Procedure Duration: 0 hours 22 minutes 46 seconds  Findings:                 The perianal and digital rectal examinations were                            normal.  Three sessile polyps were found in the sigmoid                            colon, descending colon and cecum. The polyps were                            6 to 7 mm in size. These polyps were removed with a                            cold snare. Resection and retrieval were complete.                           Two sessile polyps were found in the transverse                            colon. The polyps were 4 to 5 mm in size. These                            polyps were removed with a cold biopsy forceps.                            Resection and retrieval were complete.                           A few small-mouthed diverticula were found in the                            sigmoid colon, descending colon, transverse colon                            and hepatic flexure. There was no evidence of                            diverticular  bleeding.                           Internal hemorrhoids were found during                            retroflexion. The hemorrhoids were small and Grade                            I (internal hemorrhoids that do not prolapse).                           The exam was otherwise without abnormality on                            direct and retroflexion views. Complications:            No immediate complications. Estimated blood loss:                            None. Estimated Blood Loss:  Estimated blood loss: none. Impression:               - Three 6 to 7 mm polyps in the sigmoid colon, in                            the descending colon and in the cecum, removed with                            a cold snare. Resected and retrieved.                           - Two 4 to 5 mm polyps in the transverse colon,                            removed with a cold biopsy forceps. Resected and                            retrieved.                           - Mild diverticulosis in the sigmoid colon, in the                            descending colon, in the transverse colon and at                            the hepatic flexure. There was no evidence of                            diverticular bleeding.                           - Internal hemorrhoids.                           - The examination was otherwise normal on direct                            and retroflexion views. Recommendation:           - Repeat colonoscopy in 3 - 5 years for                            surveillance pending pathology review.                           - Patient has a contact number available for                            emergencies. The signs and symptoms of potential                            delayed complications were discussed with the  patient. Return to normal activities tomorrow.                            Written discharge instructions were provided to the                             patient.                           - High fiber diet.                           - Continue present medications.                           - Await pathology results. Ladene Artist, MD 10/21/2017 11:45:24 AM This report has been signed electronically.

## 2017-10-21 NOTE — Progress Notes (Signed)
No problems noted in the recovery room. maw 

## 2017-10-21 NOTE — Progress Notes (Signed)
Called to room to assist during endoscopic procedure.  Patient ID and intended procedure confirmed with present staff. Received instructions for my participation in the procedure from the performing physician.  

## 2017-10-21 NOTE — Patient Instructions (Signed)
YOU HAD AN ENDOSCOPIC PROCEDURE TODAY AT THE [facility]:   Refer to the procedure report that was given to you for any specific questions about what was found during the examination.  If the procedure report does not answer your questions, please call your gastroenterologist to clarify.  If you requested that your care partner not be given the details of your procedure findings, then the procedure report has been included in a sealed envelope for you to review at your convenience later.  YOU SHOULD EXPECT: Some feelings of bloating in the abdomen. Passage of more gas than usual.  Walking can help get rid of the air that was put into your GI tract during the procedure and reduce the bloating. If you had a lower endoscopy (such as a colonoscopy or flexible sigmoidoscopy) you may notice spotting of blood in your stool or on the toilet paper. If you underwent a bowel prep for your procedure, you may not have a normal bowel movement for a few days.  Please Note:  You might notice some irritation and congestion in your nose or some drainage.  This is from the oxygen used during your procedure.  There is no need for concern and it should clear up in a day or so.  SYMPTOMS TO REPORT IMMEDIATELY:   Following lower endoscopy (colonoscopy or flexible sigmoidoscopy):  Excessive amounts of blood in the stool  Significant tenderness or worsening of abdominal pains  Swelling of the abdomen that is new, acute  Fever of 100F or higher   For urgent or emergent issues, a gastroenterologist can be reached at any hour by calling [phone].   DIET:  We do recommend a small meal at first, but then you may proceed to your regular diet.  Drink plenty of fluids but you should avoid alcoholic beverages for 24 hours.  ACTIVITY:  You should plan to take it easy for the rest of today and you should NOT DRIVE or use heavy machinery until tomorrow (because of the sedation medicines used during the test).     FOLLOW UP: Our staff will call the number listed on your records the next business day following your procedure to check on you and address any questions or concerns that you may have regarding the information given to you following your procedure. If we do not reach you, we will leave a message.  However, if you are feeling well and you are not experiencing any problems, there is no need to return our call.  We will assume that you have returned to your regular daily activities without incident.  If any biopsies were taken you will be contacted by phone or by letter within the next 1-3 weeks.  Please call us at [phone] if you have not heard about the biopsies in 3 weeks.    SIGNATURES/CONFIDENTIALITY: You and/or your care partner have signed paperwork which will be entered into your electronic medical record.  These signatures attest to the fact that that the information above on your After Visit Summary has been reviewed and is understood.  Full responsibility of the confidentiality of this discharge information lies with you and/or your care-partner.    Handouts were given to your care partner on polyps, diverticulosis, hemorrhoids, and a high fiber diet with liberal fluid intake. You may resume your current medications today. Await biopsy results. Please call if any questions or concerns.    

## 2017-10-21 NOTE — Progress Notes (Signed)
Report given to PACU, vss 

## 2017-10-21 NOTE — Progress Notes (Signed)
Pt's states no medical or surgical changes since previsit or office visit. 

## 2017-10-22 ENCOUNTER — Telehealth: Payer: Self-pay | Admitting: *Deleted

## 2017-10-22 NOTE — Telephone Encounter (Signed)
Message left

## 2017-10-22 NOTE — Telephone Encounter (Signed)
  Follow up Call-  Call back number 10/21/2017  Post procedure Call Back phone  # [phone]  Permission to leave phone message Yes  Some recent data might be hidden     Patient questions:  Do you have a fever, pain , or abdominal swelling? No. Pain Score  0 *  Have you tolerated food without any problems? Yes.    Have you been able to return to your normal activities? Yes.    Do you have any questions about your discharge instructions: Diet   No. Medications  No. Follow up visit  No.  Do you have questions or concerns about your Care? No.  Actions: * If pain score is 4 or above: No action needed, pain <4.

## 2017-10-31 ENCOUNTER — Encounter: Payer: Self-pay | Admitting: Gastroenterology

## 2017-12-08 ENCOUNTER — Other Ambulatory Visit: Payer: Self-pay | Admitting: Podiatry

## 2017-12-08 ENCOUNTER — Encounter: Payer: Self-pay | Admitting: Podiatry

## 2017-12-08 ENCOUNTER — Ambulatory Visit (INDEPENDENT_AMBULATORY_CARE_PROVIDER_SITE_OTHER): Payer: BLUE CROSS/BLUE SHIELD | Admitting: Podiatry

## 2017-12-08 ENCOUNTER — Ambulatory Visit (INDEPENDENT_AMBULATORY_CARE_PROVIDER_SITE_OTHER): Payer: BLUE CROSS/BLUE SHIELD

## 2017-12-08 DIAGNOSIS — M25572 Pain in left ankle and joints of left foot: Secondary | ICD-10-CM

## 2017-12-08 DIAGNOSIS — M7750 Other enthesopathy of unspecified foot: Secondary | ICD-10-CM | POA: Diagnosis not present

## 2017-12-08 DIAGNOSIS — Q828 Other specified congenital malformations of skin: Secondary | ICD-10-CM | POA: Diagnosis not present

## 2017-12-08 DIAGNOSIS — M779 Enthesopathy, unspecified: Secondary | ICD-10-CM

## 2017-12-08 MED ORDER — DICLOFENAC SODIUM 75 MG PO TBEC
75.0000 mg | DELAYED_RELEASE_TABLET | Freq: Two times a day (BID) | ORAL | 2 refills | Status: DC
Start: 2017-12-08 — End: 2024-02-29

## 2017-12-08 MED ORDER — TRIAMCINOLONE ACETONIDE 10 MG/ML IJ SUSP
10.0000 mg | Freq: Once | INTRAMUSCULAR | Status: AC
  Administered 2017-12-08: 10 mg

## 2017-12-10 NOTE — Progress Notes (Signed)
Subjective:   Patient ID: Corey Camacho, male   DOB: 62 y.o.   MRN: [id number]   HPI Patient presents stating that she has a lot of pain in her left ankle and is been going on now for several months with no specific injury.  Patient states that she is tried to wear support shoes and not go barefoot but the pain continues.  Patient also is concerned about painful lesion formation that makes it hard to walk at times disease   ROS      Objective:  Physical Exam  Neurovascular status intact with inflammation of the medial ankle left at the insertion of the posterior tibial tendon into the navicular.  I checked muscle strength and found to be adequate and did not indicate there is a tear of the peroneal or the posterior tibial tendon at the current time and I also noted deep porokeratotic lesion sub-fifth metatarsal right     Assessment:  Acute posterior tibial tendinitis left with moderate depression of the arch and inflammation and keratotic lesion right     Plan:  H&P condition reviewed and careful sheath injection administered left 3 mg Kenalog 5 mg Xylocaine and applied fascial brace to lift up the arch.  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

## 2017-12-22 ENCOUNTER — Encounter: Payer: Self-pay | Admitting: Podiatry

## 2017-12-22 ENCOUNTER — Ambulatory Visit (INDEPENDENT_AMBULATORY_CARE_PROVIDER_SITE_OTHER): Payer: BLUE CROSS/BLUE SHIELD | Admitting: Podiatry

## 2017-12-22 DIAGNOSIS — B079 Viral wart, unspecified: Secondary | ICD-10-CM

## 2017-12-22 DIAGNOSIS — M779 Enthesopathy, unspecified: Secondary | ICD-10-CM

## 2017-12-22 DIAGNOSIS — M7750 Other enthesopathy of unspecified foot: Secondary | ICD-10-CM

## 2017-12-22 MED ORDER — METHYLPREDNISOLONE 4 MG PO TBPK: ORAL_TABLET | ORAL | 0 refills | Status: DC

## 2017-12-22 NOTE — Progress Notes (Signed)
Subjective:   Patient ID: Corey Camacho, male   DOB: 62 y.o.   MRN: [id number]   HPI Patient presents stating the tendon is feeling a lot better but he has a nodule on the bottom of his left arch that is been painful and been there for around a month.  Does not remember specific injury or foreign body associated with it   ROS      Objective:  Physical Exam  Neurovascular status intact with flatfoot deformity noted with inflammation around the posterior tib tendon as it gets close to insertion into the navicular and at its insertion into the navicular.  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  At this point I did scan for customized orthotics to try to lift up the plantar arch left and right and also I debrided the lesion fully with sterile instrumentation and applied cannot figure to it along with sterile dressings.  Patient will be seen back for [place] to recheck again in 4 weeks for orthotic dispensing and to evaluate the lesion.  If any blistering were to occur I explained to him what to do will be seen back

## 2018-01-26 ENCOUNTER — Ambulatory Visit (INDEPENDENT_AMBULATORY_CARE_PROVIDER_SITE_OTHER): Payer: BLUE CROSS/BLUE SHIELD | Admitting: Podiatry

## 2018-01-26 ENCOUNTER — Encounter: Payer: Self-pay | Admitting: Podiatry

## 2018-01-26 ENCOUNTER — Other Ambulatory Visit: Payer: BLUE CROSS/BLUE SHIELD | Admitting: Orthotics

## 2018-01-26 DIAGNOSIS — M76829 Posterior tibial tendinitis, unspecified leg: Secondary | ICD-10-CM

## 2018-01-26 DIAGNOSIS — B079 Viral wart, unspecified: Secondary | ICD-10-CM

## 2018-01-26 NOTE — Progress Notes (Signed)
Subjective:   Patient ID: Corey Camacho, male   DOB: 62 y.o.   MRN: [id number]   HPI Patient presents still getting some discomfort in the tendon and also has this lesion which is still aggravating on the inside of the arch   ROS      Objective:  Physical Exam  Neurovascular status intact with patient found to have keratotic lesion on the inside of the arch left it still is aggravated when pressed and is noted to have a flatfoot deformity     Assessment:  Tendinitis present secondary to structural flatfoot deformity and keratotic lesion plantar aspect left arch     Plan:  Debrided the lesion on the left foot which was tolerated well padded the area and we will get the orthotics back as soon as possible for this patient

## 2018-02-02 ENCOUNTER — Ambulatory Visit: Payer: BLUE CROSS/BLUE SHIELD | Admitting: Orthotics

## 2018-02-02 DIAGNOSIS — L309 Dermatitis, unspecified: Secondary | ICD-10-CM

## 2018-02-02 DIAGNOSIS — M779 Enthesopathy, unspecified: Secondary | ICD-10-CM

## 2018-02-02 DIAGNOSIS — M76829 Posterior tibial tendinitis, unspecified leg: Secondary | ICD-10-CM

## 2018-02-02 NOTE — Progress Notes (Signed)
Patient came in today to pick up custom made foot orthotics.  The goals were accomplished and the patient reported no dissatisfaction with said orthotics.  Patient was advised of breakin period and how to report any issues. 

## 2018-02-07 ENCOUNTER — Emergency Department (HOSPITAL_COMMUNITY): Payer: BLUE CROSS/BLUE SHIELD

## 2018-02-07 ENCOUNTER — Encounter (HOSPITAL_COMMUNITY): Payer: Self-pay | Admitting: Emergency Medicine

## 2018-02-07 ENCOUNTER — Emergency Department (HOSPITAL_COMMUNITY)
Admission: EM | Admit: 2018-02-07 | Discharge: 2018-02-08 | Disposition: A | Discharge status: Home / Self Care | Payer: BLUE CROSS/BLUE SHIELD | Attending: Emergency Medicine | Admitting: Emergency Medicine

## 2018-02-07 DIAGNOSIS — Z79899 Other long term (current) drug therapy: Secondary | ICD-10-CM | POA: Insufficient documentation

## 2018-02-07 DIAGNOSIS — J188 Other pneumonia, unspecified organism: Secondary | ICD-10-CM | POA: Insufficient documentation

## 2018-02-07 DIAGNOSIS — J189 Pneumonia, unspecified organism: Secondary | ICD-10-CM

## 2018-02-07 DIAGNOSIS — R079 Chest pain, unspecified: Secondary | ICD-10-CM | POA: Diagnosis present

## 2018-02-07 DIAGNOSIS — J45909 Unspecified asthma, uncomplicated: Secondary | ICD-10-CM | POA: Insufficient documentation

## 2018-02-07 DIAGNOSIS — F17210 Nicotine dependence, cigarettes, uncomplicated: Secondary | ICD-10-CM | POA: Insufficient documentation

## 2018-02-07 DIAGNOSIS — J181 Lobar pneumonia, unspecified organism: Secondary | ICD-10-CM

## 2018-02-07 LAB — I-STAT TROPONIN, ED: TROPONIN I, POC: 0 ng/mL (ref 0.00–0.08)

## 2018-02-07 LAB — BASIC METABOLIC PANEL
ANION GAP: 10 (ref 5–15)
BUN: 17 mg/dL (ref 6–20)
CALCIUM: 8.4 mg/dL — AB (ref 8.9–10.3)
CO2: 21 mmol/L — AB (ref 22–32)
CREATININE: 0.92 mg/dL (ref 0.61–1.24)
Chloride: 104 mmol/L (ref 101–111)
GFR calc Af Amer: >60 mL/min (ref >60)
GLUCOSE: 142 mg/dL — AB (ref 65–99)
Potassium: 3.6 mmol/L (ref 3.5–5.1)
Sodium: 135 mmol/L (ref 135–145)

## 2018-02-07 LAB — CBC
HCT: 45.2 % (ref 39.0–52.0)
Hemoglobin: 15.1 g/dL (ref 13.0–17.0)
MCH: 27 pg (ref 26.0–34.0)
MCHC: 33.4 g/dL (ref 30.0–36.0)
MCV: 80.9 fL (ref 78.0–100.0)
PLATELETS: 264 10*3/uL (ref 150–400)
RBC: 5.59 MIL/uL (ref 4.22–5.81)
RDW: 14.5 % (ref 11.5–15.5)
WBC: 13 10*3/uL — ABNORMAL HIGH (ref 4.0–10.5)

## 2018-02-07 MED ORDER — ACETAMINOPHEN 500 MG PO TABS
1000.0000 mg | ORAL_TABLET | Freq: Once | ORAL | Status: AC
  Administered 2018-02-07: 1000 mg via ORAL
  Filled 2018-02-07: qty 2

## 2018-02-07 NOTE — ED Triage Notes (Signed)
Reports being in the rain on Thursday then noticed pain in center of upper back on Friday.  Describes as sharp and feels sob.  Also c/o left arm pain.

## 2018-02-07 NOTE — ED Provider Notes (Signed)
TIME SEEN: 11:33 PM  CHIEF COMPLAINT: Chest pain, chills  HPI: Patient is a 62 year old male with history of asthma who presents to the emergency department with chest pain for the past 24 hours that he describes as sharp in nature and worse with coughing in the right side of his chest.  Has had chills and generalized weakness.  States he thinks symptoms started after being outside in the rain for several hours.  No productive cough.  No known sick contact but states he does work off [employer] and is exposed to many people every day.  No recent international travel.  No history of PE, DVT, exogenous estrogen use, recent fractures, surgery, trauma, hospitalization or prolonged travel. No lower extremity swelling or pain. No calf tenderness.  ROS: See HPI Constitutional: no fever  Eyes: no drainage  ENT: no runny nose   Cardiovascular:   chest pain  Resp:  SOB  GI: no vomiting or diarrhea GU: no dysuria Integumentary: no rash  Allergy: no hives  Musculoskeletal: no leg swelling  Neurological: no slurred speech ROS otherwise negative  PAST MEDICAL HISTORY/PAST SURGICAL HISTORY:  Past Medical History:  Diagnosis Date  . Anal fissure   . Asthma    using inhalers  . Diverticulosis   . Hemorrhoids   . Hemorrhoids   . Tubular adenoma of colon 08/2012    MEDICATIONS:  Prior to Admission medications   Medication Sig Start Date End Date Taking? Authorizing Provider  albuterol (PROVENTIL HFA;VENTOLIN HFA) 108 (90 Base) MCG/ACT inhaler Inhale into the lungs as needed for wheezing or shortness of breath.    [provider]  BREO ELLIPTA 200-25 MCG/INH AEPB Inhale 1 puff into the lungs daily.     [provider]  diclofenac (VOLTAREN) 75 MG EC tablet Take 1 tablet (75 mg total) by mouth 2 (two) times daily. 12/08/17   Wallene Huh, DPM  Misc Natural Products (NF FORMULAS TESTOSTERONE PO) Take by mouth. Testosterone Booster-Take 2 a day    [provider]  NON  FORMULARY Valentine 5 mg-Take as needed    [provider]  NON FORMULARY Mega Man Prostate and Virility-Take 2 pills daily    [provider]  terbinafine (LAMISIL) 250 MG tablet Please take one a day x 7days, repeat every 4 weeks x 4 months 07/05/16   Wallene Huh, DPM    ALLERGIES:  Allergies  Allergen Reactions  . Accutane [Isotretinoin] Swelling     swelling of hands    SOCIAL HISTORY:  Social History   Tobacco Use  . Smoking status: Current Every Day Smoker    Packs/day: 0.50    Types: Cigarettes  . Smokeless tobacco: Never Used  Substance Use Topics  . Alcohol use: Yes    Alcohol/week: 2.0 oz    Types: 4 Standard drinks or equivalent per week    FAMILY HISTORY: Family History  Problem Relation Age of Onset  . Esophageal cancer Father   . Prostate cancer Father   . Heart disease Mother   . Colon cancer Neg Hx   . Stomach cancer Neg Hx     EXAM: BP (!) 113/54 (BP Location: Left Arm)   Pulse 92   Temp 99.9 F (37.7 C) (Oral)   Resp 18   Ht 6' (1.829 m)   Wt 89.8 kg (198 lb)   SpO2 98%   BMI 26.85 kg/m  CONSTITUTIONAL: Alert and oriented and responds appropriately to questions. Well-appearing; well-nourished HEAD: Normocephalic EYES: Conjunctivae  clear, pupils appear equal, EOMI ENT: normal nose; moist mucous membranes NECK: Supple, no meningismus, no nuchal rigidity, no LAD  CARD: RRR; S1 and S2 appreciated; no murmurs, no clicks, no rubs, no gallops RESP: Normal chest excursion without splinting or tachypnea; breath sounds clear and equal bilaterally; no wheezes, no rhonchi, no rales, no hypoxia or respiratory distress, speaking full sentences ABD/GI: Normal bowel sounds; non-distended; soft, non-tender, no rebound, no guarding, no peritoneal signs, no hepatosplenomegaly BACK:  The back appears normal and is non-tender to palpation, there is no CVA tenderness EXT: Normal ROM in all joints; non-tender to palpation; no edema; normal  capillary refill; no cyanosis, no calf tenderness or swelling    SKIN: Normal color for age and race; warm; no rash NEURO: Moves all extremities equally PSYCH: The patient's mood and manner are appropriate. Grooming and personal hygiene are appropriate.  MEDICAL DECISION MAKING: Patient here with atypical chest pain.  Symptoms for over 24 hours.  First troponin negative.  He does have a low-grade temperature of 99.9 here, mild leukocytosis of 13,000 and patchy consolidation in the right lateral mid lung concerning for developing pneumonia.  He has no risk factors for pulmonary embolus other than age.  He is not tachycardic, tachypneic or hypoxic.  He does appear slightly dyspneic when talking to me but no hypoxia.  We will ambulate him in the emergency department to see how he does.  He would like to be discharged home with oral antibiotics which I feel is reasonable.  We will give him Tylenol for his fever and body aches.  Doubt ACS, PE, dissection.  ED PROGRESS: Patient did well ambulating.  No drop in oxygen saturation.  He again is asking to go home.  Will place him on oral Levaquin.  Discussed strict return precautions.  Recommended alternating Tylenol and Motrin, increased rest and fluid intake.  Discussed return precautions.    I feel he is safe for discharge.  Has a PCP with [facility].  At this time, I do not feel there is any life-threatening condition present. I have reviewed and discussed all results (EKG, imaging, lab, urine as appropriate) and exam findings with patient/family. I have reviewed nursing notes and appropriate previous records.  I feel the patient is safe to be discharged home without further emergent workup and can continue workup as an outpatient as needed. Discussed usual and customary return precautions. Patient/family verbalize understanding and are comfortable with this plan.  Outpatient follow-up has been provided if needed. All questions have been answered.    EKG  Interpretation  Date/Time:  Saturday February 07 2018 22:07:42 EDT Ventricular Rate:  91 PR Interval:  152 QRS Duration: 86 QT Interval:  360 QTC Calculation: 442 R Axis:   -18 Text Interpretation:  Normal sinus rhythm Normal ECG No old tracing to compare Confirmed by Calayah Guadarrama, Cyril Mourning [id number]) on 02/07/2018 11:34:18 PM         Tareva Leske, Delice Bison, DO 02/08/18 [id number]

## 2018-02-08 MED ORDER — LEVOFLOXACIN 750 MG PO TABS
750.0000 mg | ORAL_TABLET | Freq: Once | ORAL | Status: AC
  Administered 2018-02-08: 750 mg via ORAL
  Filled 2018-02-08: qty 1

## 2018-02-08 MED ORDER — LEVOFLOXACIN 750 MG PO TABS: 750.0000 mg | ORAL_TABLET | Freq: Every day | ORAL | 0 refills | Status: DC

## 2018-02-08 NOTE — Discharge Instructions (Signed)
You may alternate Tylenol 1000 mg every 6 hours as needed for pain and Ibuprofen 800 mg every 8 hours as needed for pain.  Please take Ibuprofen with food. ° °

## 2018-02-08 NOTE — ED Notes (Signed)
Walked pt est. 30' and back pulse ox stayed avg. 96-98, laid him back down in his bed and went back up to 97

## 2018-02-17 ENCOUNTER — Ambulatory Visit: Payer: BLUE CROSS/BLUE SHIELD | Admitting: Orthotics

## 2018-02-17 DIAGNOSIS — M76829 Posterior tibial tendinitis, unspecified leg: Secondary | ICD-10-CM

## 2018-02-17 NOTE — Progress Notes (Signed)
Patient came in to have f/o adjusted.  Added navicular padding and cut out shell for sweet spot.

## 2020-03-08 IMAGING — DX DG CHEST 2V
2 series · 2 of 2 positions shown · non-contrast
Comparison: February 27, 2015

CLINICAL DATA: Chest pain since last night

EXAM:
CHEST - 2 VIEW

[chest pa]
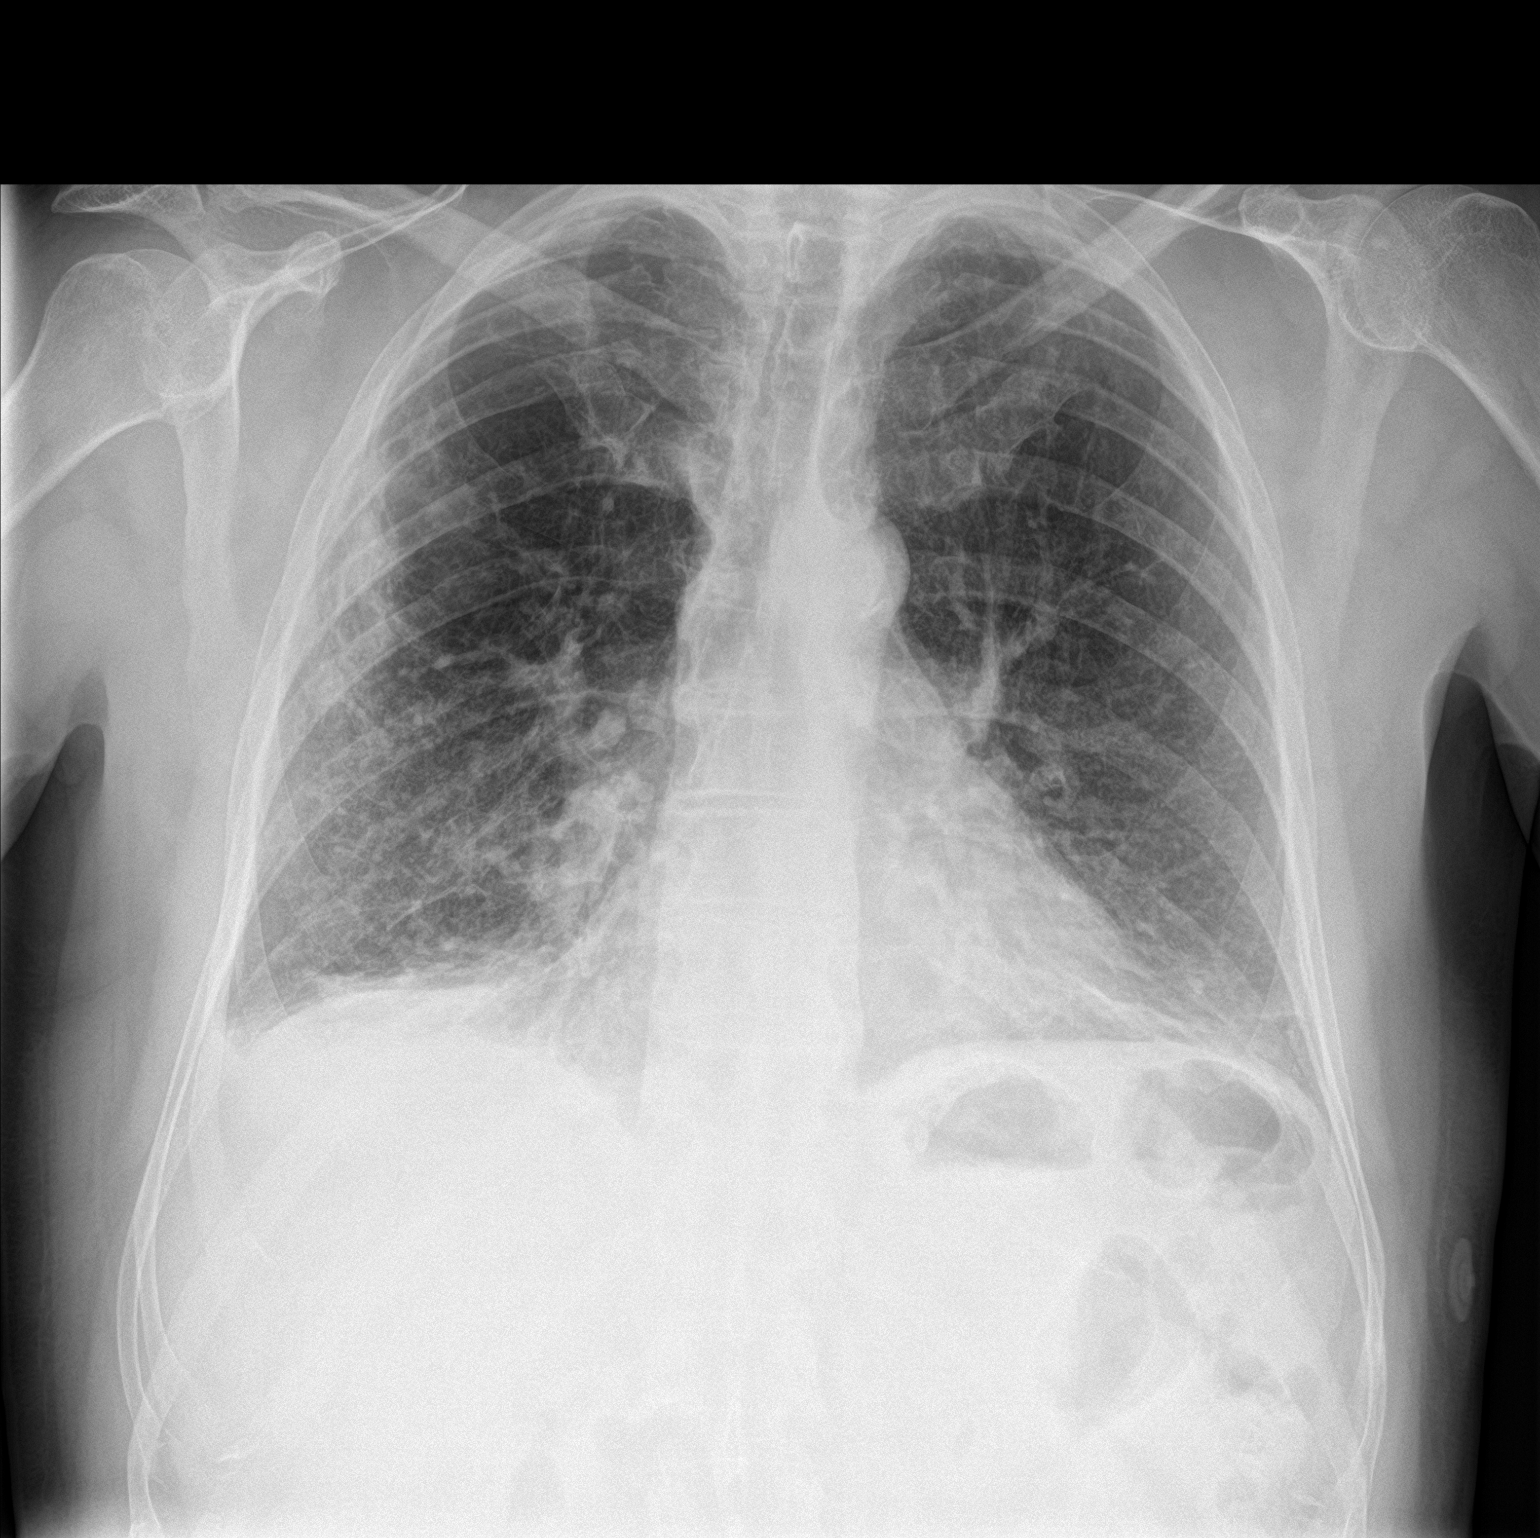

[chest lat]
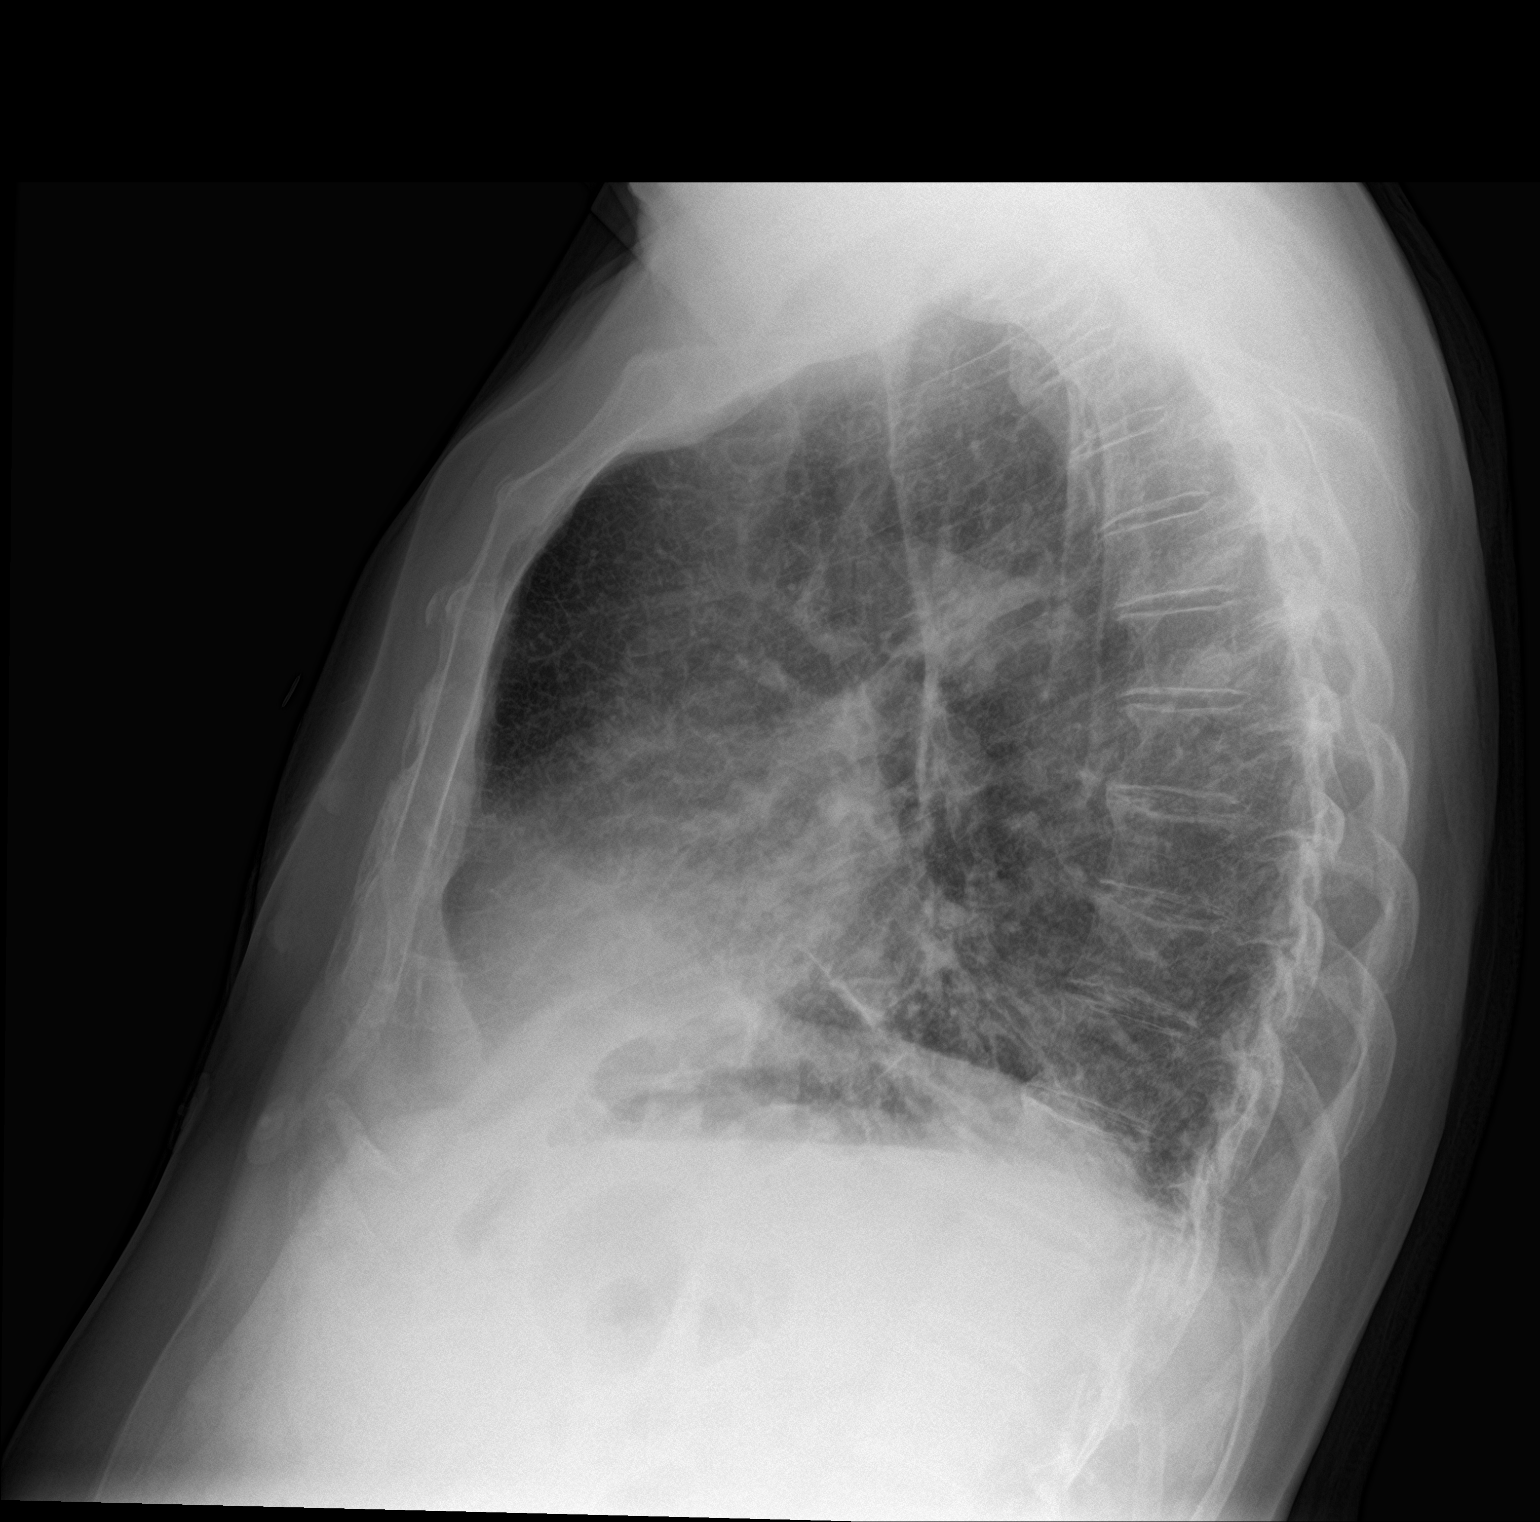

[2 of 2 positions shown; findings below may reference images not displayed]

FINDINGS: The heart size and mediastinal contours are within normal limits.
Patchy consolidation of the lateral right mid lung is identified.
Mild atelectasis of bilateral lung bases are noted. There is a
minimal right pleural effusion. The visualized skeletal structures
are unremarkable.
IMPRESSION: Patchy consolidation of the lateral right mid lung is noted.
Developing pneumonia is not excluded. Small right pleural effusion.

Atelectasis of bilateral lung bases.

## 2024-01-06 ENCOUNTER — Other Ambulatory Visit (HOSPITAL_COMMUNITY): Payer: Self-pay | Admitting: Physician Assistant

## 2024-01-06 DIAGNOSIS — H903 Sensorineural hearing loss, bilateral: Secondary | ICD-10-CM

## 2024-01-26 ENCOUNTER — Other Ambulatory Visit (HOSPITAL_COMMUNITY): Payer: Self-pay | Admitting: Physician Assistant

## 2024-01-26 DIAGNOSIS — H903 Sensorineural hearing loss, bilateral: Secondary | ICD-10-CM

## 2024-02-04 ENCOUNTER — Ambulatory Visit (HOSPITAL_COMMUNITY)
Admission: RE | Admit: 2024-02-04 | Discharge: 2024-02-04 | Disposition: A | Discharge status: Home / Self Care | Source: Ambulatory Visit | Attending: Physician Assistant | Admitting: Physician Assistant

## 2024-02-04 DIAGNOSIS — H903 Sensorineural hearing loss, bilateral: Secondary | ICD-10-CM | POA: Insufficient documentation

## 2024-02-04 MED ORDER — IOHEXOL 300 MG/ML  SOLN
75.0000 mL | Freq: Once | INTRAMUSCULAR | Status: AC | PRN
  Administered 2024-02-04: 75 mL via INTRAVENOUS

## 2024-02-28 ENCOUNTER — Other Ambulatory Visit: Payer: Self-pay

## 2024-02-28 ENCOUNTER — Emergency Department (HOSPITAL_BASED_OUTPATIENT_CLINIC_OR_DEPARTMENT_OTHER)
Admission: EM | Admit: 2024-02-28 | Discharge: 2024-02-29 | Disposition: A | Discharge status: Home / Self Care | Attending: Emergency Medicine | Admitting: Emergency Medicine

## 2024-02-28 ENCOUNTER — Encounter (HOSPITAL_BASED_OUTPATIENT_CLINIC_OR_DEPARTMENT_OTHER): Payer: Self-pay

## 2024-02-28 DIAGNOSIS — L0231 Cutaneous abscess of buttock: Secondary | ICD-10-CM | POA: Diagnosis present

## 2024-02-28 NOTE — ED Triage Notes (Signed)
 Pt presents via POV c/o abscess/cyst to buttocks. Reports issue has been reoccurring since a child.

## 2024-02-29 MED ORDER — CLINDAMYCIN HCL 150 MG PO CAPS
300.0000 mg | ORAL_CAPSULE | Freq: Once | ORAL | Status: AC
  Administered 2024-02-29: 300 mg via ORAL
  Filled 2024-02-29: qty 2

## 2024-02-29 MED ORDER — CLINDAMYCIN HCL 300 MG PO CAPS: 300.0000 mg | ORAL_CAPSULE | Freq: Three times a day (TID) | ORAL | 0 refills | Status: DC

## 2024-02-29 NOTE — ED Provider Notes (Signed)
 [place] EMERGENCY DEPARTMENT AT [facility] Provider Note   CSN: [id number] Arrival date & time: 02/28/24  2108     History  Chief Complaint  Patient presents with   Abscess    Corey Camacho is a 68 y.o. male.  The history is provided by the patient.  Patient reports he was recently treated for a testicular infection and just completed a course of doxycycline Overall his testicular pain is improved Over the past 24 hours he has noted a cyst forming on his buttocks.  He has had this previously.  Denies previous surgery but it typically resolves with antibiotics.  No fevers or vomiting. He denies any other acute complaints     Home Medications Prior to Admission medications   Medication Sig Start Date End Date Taking? Authorizing Provider  clindamycin (CLEOCIN) 300 MG capsule Take 1 capsule (300 mg total) by mouth 3 (three) times daily. 02/29/24  Yes Eldon Greenland, MD  albuterol (PROVENTIL HFA;VENTOLIN HFA) 108 (90 Base) MCG/ACT inhaler Inhale into the lungs as needed for wheezing or shortness of breath.    [provider]  BREO ELLIPTA 200-25 MCG/INH AEPB Inhale 1 puff into the lungs daily.     [provider]  ibuprofen (ADVIL,MOTRIN) 200 MG tablet Take 200 mg by mouth every 6 (six) hours as needed for mild pain.    [provider]      Allergies    Accutane [isotretinoin]    Review of Systems   Review of Systems  Constitutional:  Negative for fever.  Gastrointestinal:  Negative for vomiting.  Skin:  Positive for wound.    Physical Exam Updated Vital Signs BP (!) 107/56   Pulse 68   Temp 98.1 F (36.7 C) (Oral)   Resp 16   Ht 1.829 m (6')   Wt 89 kg   SpO2 99%   BMI 26.61 kg/m  Physical Exam CONSTITUTIONAL: Well developed/well nourished, no acute distress HEAD: Normocephalic/atraumatic ABDOMEN: soft, nontender, no rebound or guarding, bowel sounds noted throughout abdomen GU: Testicles descended bilaterally without  any tenderness.  No scrotal erythema, no crepitus or tenderness.  No tenderness to the perineum Nurse present for exam Rectal exam reveals abscess in the right gluteal region.  There is no perirectal abscess.  No crepitus, no discharge. Nurse present for entire exam NEURO: Pt is awake/alert/appropriate, moves all extremitiesx4.  No facial droop.     ED Results / Procedures / Treatments   Labs (all labs ordered are listed, but only abnormal results are displayed) Labs Reviewed - No data to display  EKG None  Radiology No results found.  Procedures Procedures    Medications Ordered in ED Medications  clindamycin (CLEOCIN) capsule 300 mg (has no administration in time range)    ED Course/ Medical Decision Making/ A&P                                 Medical Decision Making Risk Prescription drug management.   Patient presents with right gluteal abscess.  I offered bedside I&D and antibiotics.  Patient would like to defer I&D for now and try oral antibiotics.  I advised that he will likely need I&D at some point, he understands this.  Overall patient is well-appearing, no acute distress.  He is not septic appearing. Advised warm compresses, antibiotics and return if there is any worsening for incision and drainage  Final Clinical Impression(s) / ED Diagnoses Final diagnoses:  Gluteal abscess    Rx / DC Orders ED Discharge Orders          Ordered    clindamycin (CLEOCIN) 300 MG capsule  3 times daily        02/29/24 0047              Eldon Greenland, MD 02/29/24 0050

## 2024-04-06 ENCOUNTER — Ambulatory Visit: Admitting: Podiatry

## 2024-04-06 ENCOUNTER — Encounter: Payer: Self-pay | Admitting: Podiatry

## 2024-04-06 DIAGNOSIS — M79672 Pain in left foot: Secondary | ICD-10-CM | POA: Diagnosis not present

## 2024-04-06 DIAGNOSIS — M79671 Pain in right foot: Secondary | ICD-10-CM

## 2024-04-06 DIAGNOSIS — B351 Tinea unguium: Secondary | ICD-10-CM

## 2024-04-06 DIAGNOSIS — M7751 Other enthesopathy of right foot: Secondary | ICD-10-CM | POA: Diagnosis not present

## 2024-04-06 DIAGNOSIS — M7752 Other enthesopathy of left foot: Secondary | ICD-10-CM

## 2024-04-06 NOTE — Progress Notes (Signed)
 New patient presents today with complaint of pain on the feet bilaterally pain around the nails with wearing shoes and walking.  He says nails are very thick.  Some tenderness of the hallux bilaterally.  Physical exam:  General appearance: Pleasant, and in no acute distress. AOx3.  Vascular: Pedal pulses: DP 2/4, PT 2/4.  Mild edema lower legs bilaterally. Capillary fill time immediate.  Neurological: Light touch intact feet bilaterally.  Normal Achilles reflex bilaterally.  No clonus or spasticity noted.   Dermatologic:   Thick dystrophic nails with subungual debris and discoloration and thickening thickening along the nail folds 1 through 5 bilaterally skin normal temperature bilaterally.  Skin normal color, tone, and texture bilaterally.   Musculoskeletal: Hammertoes 2 through 5 bilaterally.  Normal muscle strength feet bilaterally.  Normal range of motion feet and ankles bilaterally.  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  Pain feet bilaterally. 2.  Painful onychomycosis 1 through 5 bilaterally. 3.  Capsulitis  Plan: -New patient office visit level 3 for evaluation and management - Debrided onychomycotic nails 1 through 5 bilaterally. - Discussed with him proper shoes to wear to keep pressure off the bunion deformities in the great toes.  Wear good well cushioned socks.   Return 3 ,min RFC

## 2024-07-06 ENCOUNTER — Ambulatory Visit (INDEPENDENT_AMBULATORY_CARE_PROVIDER_SITE_OTHER): Admitting: Podiatry

## 2024-07-06 ENCOUNTER — Encounter: Payer: Self-pay | Admitting: Podiatry

## 2024-07-06 VITALS — Ht 72.0 in | Wt 196.0 lb

## 2024-07-06 DIAGNOSIS — M79671 Pain in right foot: Secondary | ICD-10-CM | POA: Diagnosis not present

## 2024-07-06 DIAGNOSIS — M79672 Pain in left foot: Secondary | ICD-10-CM | POA: Diagnosis not present

## 2024-07-06 DIAGNOSIS — B351 Tinea unguium: Secondary | ICD-10-CM

## 2024-07-06 NOTE — Progress Notes (Signed)
 Patient presents for evaluation and treatment of tenderness and some redness around nails feet.  Tenderness around toes with walking and wearing shoes.  Physical exam:  General appearance: Alert, pleasant, and in no acute distress.  Vascular: Pedal pulses: DP 2/4 B/L, PT 2/4 B/L. Mild edema lower legs bilaterally  Neu  Dermatologic:  Nails thickened, disfigured, discolored 1-5 BL with subungual debris.  Redness and hypertrophic nail folds along nail folds bilaterally but no signs of drainage or infection.  Musculoskeletal:     Diagnosis: 1. Painful onychomycotic nails 1 through 5 bilaterally. 2. Pain toes 1 through 5 bilaterally.  Plan: -Debrided onychomycotic nails 1 through 5 bilaterally.  Sharply debrided nails with nail clipper and reduced with a power bur.  Return 3 months RFC

## 2024-10-06 ENCOUNTER — Encounter: Payer: Self-pay | Admitting: Podiatry

## 2024-10-06 ENCOUNTER — Ambulatory Visit: Admitting: Podiatry

## 2024-10-06 DIAGNOSIS — M79672 Pain in left foot: Secondary | ICD-10-CM | POA: Diagnosis not present

## 2024-10-06 DIAGNOSIS — B351 Tinea unguium: Secondary | ICD-10-CM | POA: Diagnosis not present

## 2024-10-06 DIAGNOSIS — M79671 Pain in right foot: Secondary | ICD-10-CM

## 2024-10-06 NOTE — Progress Notes (Signed)
 Patient presents for evaluation and treatment of tenderness and some redness around nails feet.  Tenderness around toes with walking and wearing shoes.  Physical exam:  General appearance: Alert, pleasant, and in no acute distress.  Vascular: Pedal pulses: DP 2/4 B/L, PT 2/4 B/L. Mild edema lower legs bilaterally.  Capillary refill time immediate bilaterally  Neurologic:  Dermatologic:  Nails thickened, disfigured, discolored 1-5 BL with subungual debris.  Redness and hypertrophic nail folds along nail folds bilaterally but no signs of drainage or infection.  Musculoskeletal:     Diagnosis: 1. Painful onychomycotic nails 1 through 5 bilaterally. 2. Pain toes 1 through 5 bilaterally.  Plan: -Debrided onychomycotic nails 1 through 5 bilaterally.  Sharply debrided nails with nail clipper and reduced with a power bur.  Return 3 months RFC

## 2025-01-04 ENCOUNTER — Ambulatory Visit: Admitting: Podiatry
